# Patient Record
Sex: Female | Born: 1955 | Race: White | Hispanic: No | Marital: Married | Smoking: Current some day smoker
Health system: Southern US, Community
[De-identification: ages and names within clinical notes are randomized; demographics above are authoritative.]

## PROBLEM LIST (undated history)

## (undated) DIAGNOSIS — K633 Ulcer of intestine: Secondary | ICD-10-CM

## (undated) DIAGNOSIS — H269 Unspecified cataract: Secondary | ICD-10-CM

## (undated) DIAGNOSIS — T8859XA Other complications of anesthesia, initial encounter: Secondary | ICD-10-CM

## (undated) DIAGNOSIS — M1990 Unspecified osteoarthritis, unspecified site: Secondary | ICD-10-CM

## (undated) DIAGNOSIS — G4730 Sleep apnea, unspecified: Secondary | ICD-10-CM

## (undated) DIAGNOSIS — K8050 Calculus of bile duct without cholangitis or cholecystitis without obstruction: Secondary | ICD-10-CM

## (undated) DIAGNOSIS — M8580 Other specified disorders of bone density and structure, unspecified site: Secondary | ICD-10-CM

## (undated) DIAGNOSIS — E669 Obesity, unspecified: Secondary | ICD-10-CM

## (undated) DIAGNOSIS — T4145XA Adverse effect of unspecified anesthetic, initial encounter: Secondary | ICD-10-CM

## (undated) DIAGNOSIS — E785 Hyperlipidemia, unspecified: Secondary | ICD-10-CM

## (undated) DIAGNOSIS — E559 Vitamin D deficiency, unspecified: Secondary | ICD-10-CM

## (undated) DIAGNOSIS — F418 Other specified anxiety disorders: Secondary | ICD-10-CM

## (undated) DIAGNOSIS — J189 Pneumonia, unspecified organism: Secondary | ICD-10-CM

## (undated) DIAGNOSIS — K219 Gastro-esophageal reflux disease without esophagitis: Secondary | ICD-10-CM

## (undated) HISTORY — DX: Hyperlipidemia, unspecified: E78.5

## (undated) HISTORY — PX: OOPHORECTOMY: SHX86

## (undated) HISTORY — DX: Unspecified osteoarthritis, unspecified site: M19.90

## (undated) HISTORY — PX: TUBAL LIGATION: SHX77

## (undated) HISTORY — DX: Vitamin D deficiency, unspecified: E55.9

## (undated) HISTORY — DX: Pneumonia, unspecified organism: J18.9

## (undated) HISTORY — DX: Unspecified cataract: H26.9

## (undated) HISTORY — PX: APPENDECTOMY: SHX54

## (undated) HISTORY — DX: Calculus of bile duct without cholangitis or cholecystitis without obstruction: K80.50

## (undated) HISTORY — DX: Obesity, unspecified: E66.9

## (undated) HISTORY — DX: Other specified disorders of bone density and structure, unspecified site: M85.80

## (undated) HISTORY — PX: HYSTEROSCOPY: SHX211

## (undated) HISTORY — DX: Sleep apnea, unspecified: G47.30

## (undated) HISTORY — PX: CATARACT EXTRACTION: SUR2

## (undated) HISTORY — PX: TONSILLECTOMY: SUR1361

## (undated) HISTORY — PX: OTHER SURGICAL HISTORY: SHX169

---

## 2000-10-08 ENCOUNTER — Other Ambulatory Visit: Admission: RE | Admit: 2000-10-08 | Discharge: 2000-10-08 | Payer: Self-pay | Admitting: Gynecology

## 2000-10-18 ENCOUNTER — Other Ambulatory Visit: Admission: RE | Admit: 2000-10-18 | Discharge: 2000-10-18 | Payer: Self-pay | Admitting: Gynecology

## 2000-11-16 ENCOUNTER — Ambulatory Visit (HOSPITAL_COMMUNITY): Admission: RE | Admit: 2000-11-16 | Discharge: 2000-11-16 | Payer: Self-pay | Admitting: Gynecology

## 2001-09-20 ENCOUNTER — Other Ambulatory Visit: Admission: RE | Admit: 2001-09-20 | Discharge: 2001-09-20 | Payer: Self-pay | Admitting: Gynecology

## 2001-10-25 ENCOUNTER — Encounter: Admission: RE | Admit: 2001-10-25 | Discharge: 2001-10-25 | Payer: Self-pay | Admitting: Gynecology

## 2001-10-25 ENCOUNTER — Encounter: Payer: Self-pay | Admitting: Gynecology

## 2001-10-31 ENCOUNTER — Encounter: Admission: RE | Admit: 2001-10-31 | Discharge: 2001-10-31 | Payer: Self-pay | Admitting: *Deleted

## 2002-08-08 ENCOUNTER — Other Ambulatory Visit: Admission: RE | Admit: 2002-08-08 | Discharge: 2002-08-08 | Payer: Self-pay | Admitting: Gynecology

## 2002-08-09 ENCOUNTER — Ambulatory Visit (HOSPITAL_COMMUNITY): Admission: RE | Admit: 2002-08-09 | Discharge: 2002-08-09 | Payer: Self-pay | Admitting: Gynecology

## 2002-08-09 ENCOUNTER — Encounter (INDEPENDENT_AMBULATORY_CARE_PROVIDER_SITE_OTHER): Payer: Self-pay

## 2003-08-04 HISTORY — PX: ABDOMINAL HYSTERECTOMY: SHX81

## 2003-08-09 ENCOUNTER — Other Ambulatory Visit: Admission: RE | Admit: 2003-08-09 | Discharge: 2003-08-09 | Payer: Self-pay | Admitting: Gynecology

## 2004-07-15 ENCOUNTER — Encounter (INDEPENDENT_AMBULATORY_CARE_PROVIDER_SITE_OTHER): Payer: Self-pay | Admitting: *Deleted

## 2004-07-15 ENCOUNTER — Inpatient Hospital Stay (HOSPITAL_COMMUNITY): Admission: RE | Admit: 2004-07-15 | Discharge: 2004-07-16 | Payer: Self-pay | Admitting: Gynecology

## 2004-12-29 IMAGING — CR DG CHEST 2V
2 series · 2 of 2 positions shown · non-contrast
Comparison: None.

CLINICAL DATA: Dysfunctional uterine bleeding and pelvic pain.  Pre-op respiratory exam.
 CHEST ? 2 VIEW:

[view not recorded (1 of 2)]
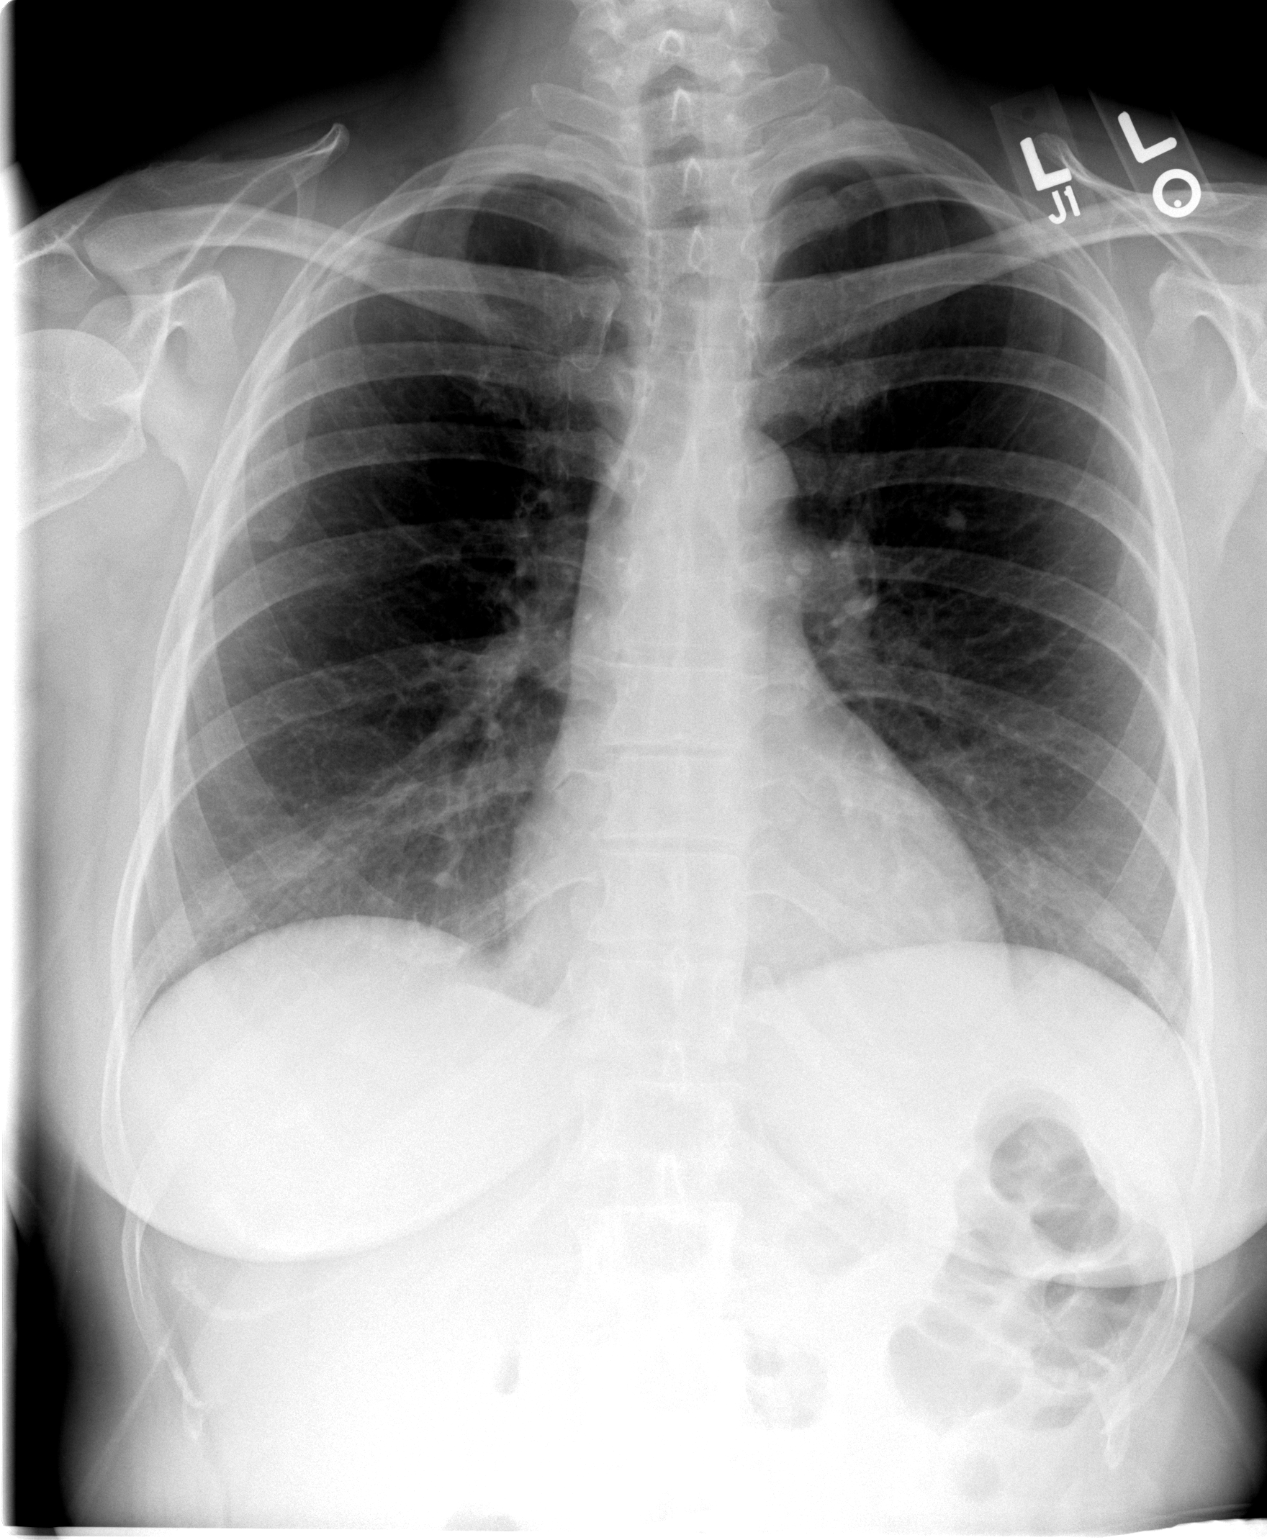

[view not recorded (2 of 2)]
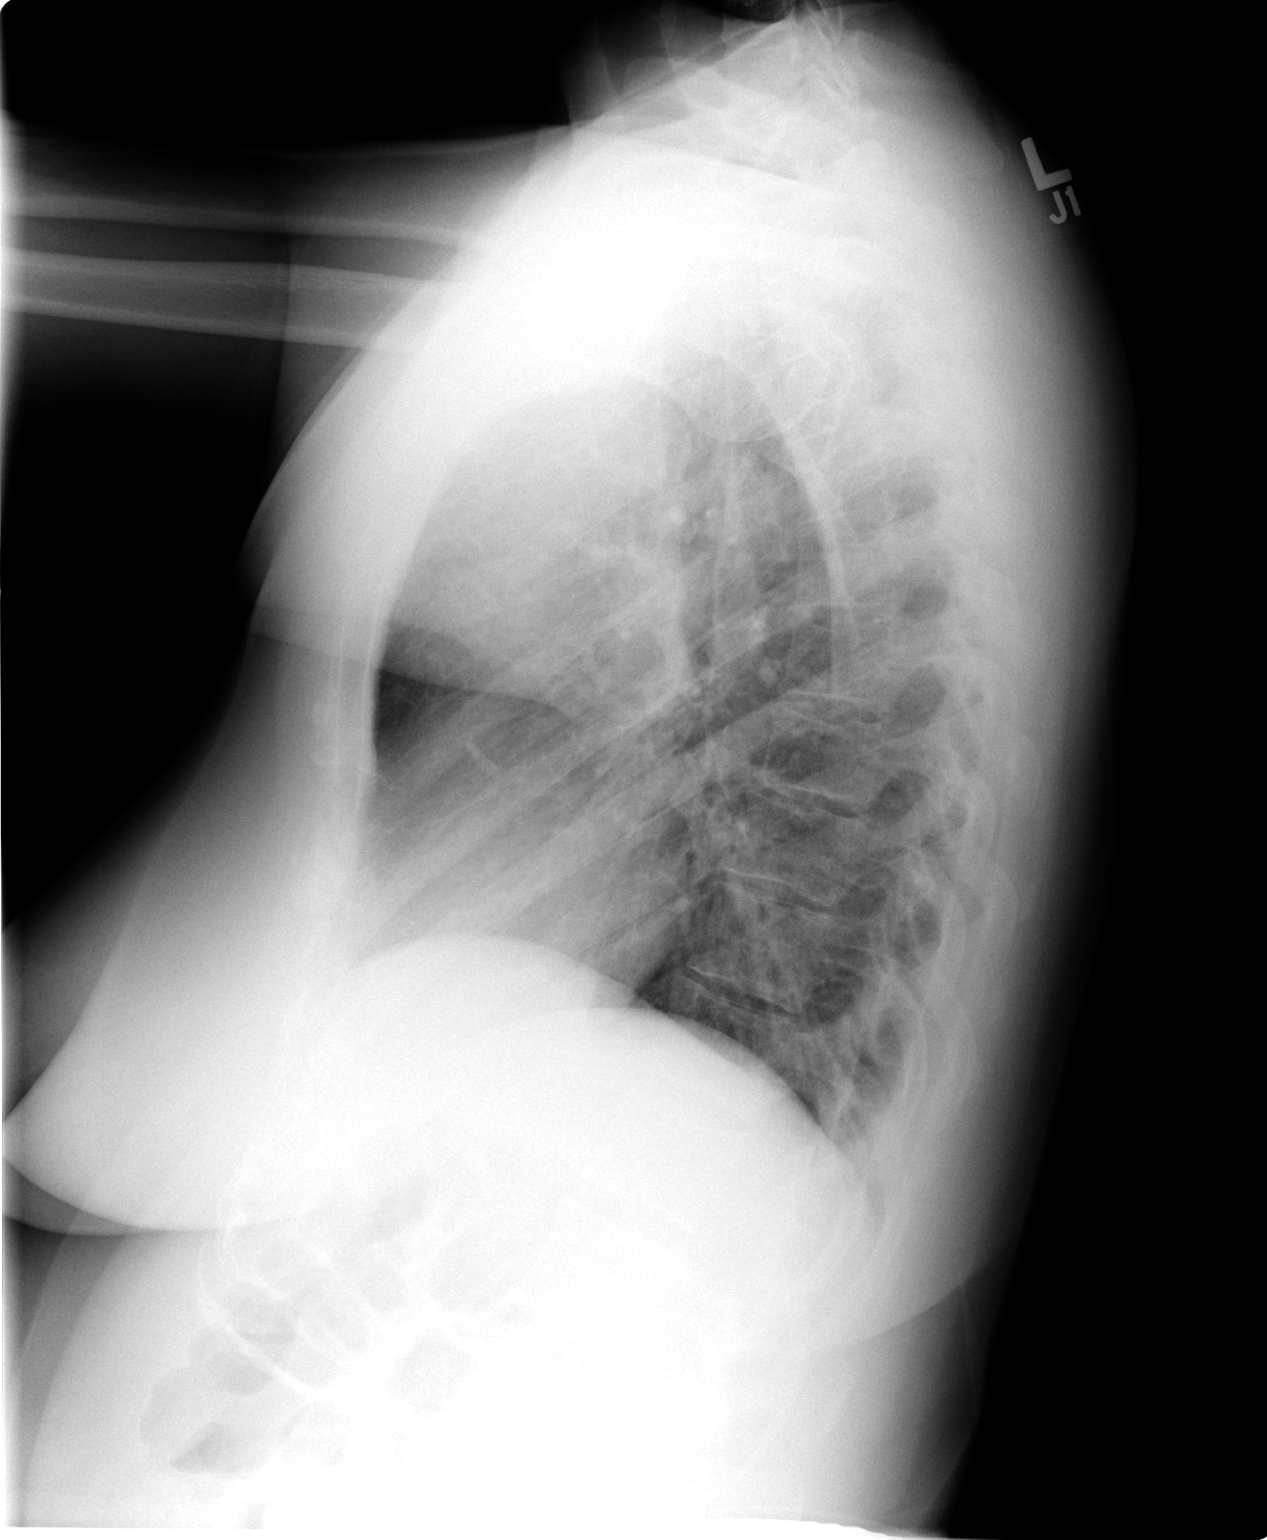

[2 of 2 positions shown; findings below may reference images not displayed]

Heart size and mediastinal contours are normal.  Both lungs are clear.  Calcified granuloma is seen in the left upper lobe and calcified left hilar lymph nodes are also seen, consistent with old granulomatous disease.
IMPRESSION: No active disease.

## 2005-08-11 ENCOUNTER — Other Ambulatory Visit: Admission: RE | Admit: 2005-08-11 | Discharge: 2005-08-11 | Payer: Self-pay | Admitting: Gynecology

## 2005-08-26 ENCOUNTER — Encounter: Admission: RE | Admit: 2005-08-26 | Discharge: 2005-08-26 | Payer: Self-pay | Admitting: Gynecology

## 2005-10-25 ENCOUNTER — Emergency Department: Payer: Self-pay | Admitting: Emergency Medicine

## 2005-11-04 ENCOUNTER — Emergency Department: Payer: Self-pay | Admitting: Emergency Medicine

## 2006-08-03 HISTORY — PX: COLONOSCOPY: SHX174

## 2006-09-15 ENCOUNTER — Other Ambulatory Visit: Admission: RE | Admit: 2006-09-15 | Discharge: 2006-09-15 | Payer: Self-pay | Admitting: Gynecology

## 2006-10-02 DIAGNOSIS — J189 Pneumonia, unspecified organism: Secondary | ICD-10-CM

## 2006-10-02 DIAGNOSIS — K633 Ulcer of intestine: Secondary | ICD-10-CM

## 2006-10-02 HISTORY — DX: Pneumonia, unspecified organism: J18.9

## 2006-10-02 HISTORY — DX: Ulcer of intestine: K63.3

## 2006-10-19 ENCOUNTER — Ambulatory Visit: Payer: Self-pay | Admitting: Internal Medicine

## 2006-11-02 ENCOUNTER — Ambulatory Visit: Payer: Self-pay | Admitting: Internal Medicine

## 2006-11-02 ENCOUNTER — Encounter (INDEPENDENT_AMBULATORY_CARE_PROVIDER_SITE_OTHER): Payer: Self-pay | Admitting: *Deleted

## 2006-12-13 ENCOUNTER — Ambulatory Visit: Payer: Self-pay | Admitting: Internal Medicine

## 2007-10-06 ENCOUNTER — Other Ambulatory Visit: Admission: RE | Admit: 2007-10-06 | Discharge: 2007-10-06 | Payer: Self-pay | Admitting: Gynecology

## 2007-10-26 ENCOUNTER — Encounter: Admission: RE | Admit: 2007-10-26 | Discharge: 2007-10-26 | Payer: Self-pay | Admitting: Gynecology

## 2009-01-09 ENCOUNTER — Other Ambulatory Visit: Admission: RE | Admit: 2009-01-09 | Discharge: 2009-01-09 | Payer: Self-pay | Admitting: Gynecology

## 2009-01-09 ENCOUNTER — Ambulatory Visit: Payer: Self-pay | Admitting: Women's Health

## 2009-01-09 ENCOUNTER — Encounter: Payer: Self-pay | Admitting: Women's Health

## 2009-06-17 ENCOUNTER — Encounter: Admission: RE | Admit: 2009-06-17 | Discharge: 2009-06-17 | Payer: Self-pay | Admitting: Internal Medicine

## 2009-09-18 ENCOUNTER — Ambulatory Visit: Payer: Self-pay | Admitting: Women's Health

## 2009-11-06 ENCOUNTER — Encounter: Admission: RE | Admit: 2009-11-06 | Discharge: 2009-12-10 | Payer: Self-pay | Admitting: Endocrinology

## 2009-11-12 ENCOUNTER — Ambulatory Visit: Payer: Self-pay | Admitting: Internal Medicine

## 2010-01-23 ENCOUNTER — Ambulatory Visit: Payer: Self-pay | Admitting: Women's Health

## 2010-12-16 NOTE — Assessment & Plan Note (Signed)
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  The biopsy showed  chronic as well as acute inflammation.  No granulomatous disease, no  dysplasia.  The patient is completely asymptomatic and remains  asymptomatic as far as any pain is concerned.  Her CBC showed normal  hemoglobin and hematocrit.  She is quite puzzled by the finding and has  been quite worried about it.   The patient has a history of complicated appendectomy at age of 43  requiring a couple of explorations and percutaneous drain from a  perforated appendix.  She denies taking any NSAID except maybe once a  month.  There is no family history of colon disease.  Her bowel habits  are regular.   PHYSICAL EXAMINATION:  VITAL SIGNS:  Blood pressure 126/72, pulse 80 and  weight 197 pounds.  LUNGS:  Clear to auscultation.  HEART:  Normal S1, S2.  ABDOMEN:  Slightly obese, but relaxed with large paramedian scar in the  right lower quadrant from previous appendectomy.  There was no palpable  tenderness and no mass, bowel sounds were normoactive.   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  There is no evidence of  inflammatory bowel disease or infection or syste ic disease.  She is a  smoker and may have some small vessel disease leading to possible bowl  ischemia.  She also had extensive surgery and has possibly compromised  microcirculation in the cecal area.  Since the ulcer is benign and she  is not having any symptoms ,nothing has to be done about it.  I would  consider repeating  her colonoscopy in 5-7 years, I would like to see her  in the office in one year and we are going to check her sedimentation  rate and ANA titer to rule out any sort of chronic inflammatory  condition today.Pt was satisfied with our conversation and plans.     Heidi Morton. Juanda Chance, MD  Electronically Signed    DMB/MedQ  DD: 12/13/2006  DT: 12/13/2006  Job #: [id number]   cc:   Heidi Pugh, M.D.

## 2010-12-19 NOTE — Op Note (Signed)
NAMEVIRGIA, Heidi Pugh                ACCOUNT NO.:  [id number]   MEDICAL RECORD NO.:  [id number]          PATIENT TYPE:  OBV   LOCATION:  9399                          FACILITY:  [facility]   PHYSICIAN:  Ivor Costa. Farrel Gobble, M.D. DATE OF BIRTH:  1956-01-16   DATE OF PROCEDURE:  07/14/2004  DATE OF DISCHARGE:                                 OPERATIVE REPORT   PREOPERATIVE DIAGNOSIS:  DUB.  Pelvic pain.   POSTOPERATIVE DIAGNOSIS:  DUB.  Pelvic pain.  Plus pelvic adhesions.   PROCEDURE:  Laparoscopically-assisted vaginal hysterectomy with BSO, lysis  of adhesions, and removal of IUD.   SURGEON:  Ivor Costa. Farrel Gobble, M.D.   ASSISTANTMarcial Pacas P. Fontaine, M.D.   ANESTHESIA:  General.   FLUIDS REPLACED:  1700 mL of Ringer's lactate.   ESTIMATED BLOOD LOSS:  150 mL.   URINE OUTPUT:  350 mL of clear urine.   FINDINGS:  The uterus was bulky.  Tubes are status post tubal ligation.  Ovaries were mobile and cystic.  There were filmy adhesions on the right  side wall from the pelvis extending toward the liver.   COMPLICATIONS:  None.   PATHOLOGY:  Uterus, cervix, tubes, and ovaries.   DESCRIPTION OF PROCEDURE:  The patient was taken to the operating room where  general anesthesia was induced, and she was placed in the dorsal lithotomy  position and prepped and draped in the usual sterile fashion.  A bivalve  speculum was placed in the vagina. The cervix was visualized and the IUD was  removed with a Kelly clamp.  The uterine manipulator was then placed.  Gloves were changed and attention was turned to the abdomen where an  infraumbilical incision was made with the scalpel.  Free flow of fluid was  noted.  Opening pressure was 7.  After pneumoperitoneum was created above  the liver, the Veress was removed and disposable 10/11 was placed in the  infraumbilical port.  Placement in the abdomen was confirmed.  Two 5 mm  ports were made in the right and left lower quadrant under direct  visualization.  The uterus was elevated.  The pelvis was inspected.  The  adhesions were noted.  The uterus was seated and the infundibulopelvic  ligament was identified on the left-hand side prior to cauterization and  transection.  The ureter was identified and noted to be free from the  pedicle.  The IP was then treated as mentioned above as well as the  posterior broad ligament and the round ligament on the left hand side.  The  anterior leaf of the broad ligament was partially incised and a portion of  the bladder flap was created.  Attention was then turned toward the right  hand side and in a similar fashion, the IP was identified, the ureter was  identified.  The IP was then cauterized, transected, as well as the  posterior broad ligament to the round in a similar fashion.  The anterior  leaf of the broad ligament was then incised.  The vesicouterine peritoneum  was identified, tented up, and the bladder flap  was then created connecting  to the left hand portion.  The bladder flap was then dissected down slightly  and attention was then turned vaginally.  The legs were elevated apart.  The  pneumoperitoneum was partially released.  The sterile weighted speculum was  then placed in the vagina.  The cervix was visualized.  A Jacobs tenaculum  was then placed.  The vaginal mucosa was injected circumferentially with 15  mL of 1% lidocaine with epinephrine.  The vaginal mucosa was then scored  circumferentially, the cervix was deviated cephalad, and a posterior  colpotomy was then performed.  The incision was extended laterally.  A long  sterile weighted speculum was then placed in the posterior cul-de-sac.  The  vaginal mucosa was dissected off bluntly. The uterosacral ligaments were  then transected and suture ligated with 0 Vicryl done bilaterally.  The  cardinal ligaments were similarly transected and suture ligated.  The  vaginal mucosa was then dissected off further anteriorly and  anterior  colpotomy was sharply performed.  The bladder was protected with a retractor  which was placed in the anterior colpotomy site.  The uterine vessels were  then transected and suture ligated again with 0 Vicryl.  The dissection  carried up until we were successfully able to deliver the fundus.  The  remaining pedicle was then transected after placement of a Heaney clamp and  the specimen was passed off the field.  Two free ties were placed on these  residual pedicles.  The pelvis was irrigated.  It was noted to be  hemostatic.  The peritoneum was then plicated to the vaginal mucosa from  above the uterosacral ligament.  Posterior vagina to above the uterosacral  ligament on the opposite side.  The short weighted speculum had been placed  prior to this.  The anterior peritoneum was grasped with an Allis clamp and  held with the anterior vaginal mucosa.  The vaginal mucosa was then plicated  anterior to posterior with 0 Vicryl popoffs.  A small area of bleeding was  noted on the vaginal mucosa which was treated with cautery.  The vagina was  otherwise unremarkable.  The pneumoperitoneum was then recreated above.  The  anterior posterior plication of the peritoneum was successful.  There was no  evidence of hematoma.  The pelvis was otherwise hemostatic.  Because the  patient was having a lot of pain, we did elect to dissect down the filmy  adhesions that were on the right hand side.  They were grasped with the  tripolar and in a stepwise fashion, the adhesions were cauterized where  necessary and otherwise transected with the tripolar.  At the end, the  entire anterior abdominal wall was noted to be free of adhesions.  The  instruments were then removed, the pneumoperitoneum was released.  The  infraumbilical fascia was closed with a figure-of-eight of 0 Vicryl.  The  umbilical port was closed with 3-0 plain.  The lower ports were closed with Dermabond.  The patient tolerated the  procedure well.  Needle, sponge, and  instrument counts correct x2.  She was given Mefoxin intraoperatively and  transferred to the PACU in stable condition.     Trac   THL/MEDQ  D:  07/14/2004  T:  07/14/2004  Job:  [id number]

## 2010-12-19 NOTE — Op Note (Signed)
NAME:  Heidi Pugh, Heidi Pugh                          ACCOUNT NO.:  [id number]   MEDICAL RECORD NO.:  [id number]                   PATIENT TYPE:  AMB   LOCATION:  SDC                                  FACILITY:  [facility]   PHYSICIAN:  Ivor Costa. Farrel Gobble, M.D.              DATE OF BIRTH:  12-Feb-1956   DATE OF PROCEDURE:  08/09/2002  DATE OF DISCHARGE:                                 OPERATIVE REPORT   PREOPERATIVE DIAGNOSES:  1. Dysfunctional bleeding.  2. Endometrial polyp.   POSTOPERATIVE DIAGNOSES:  1. Dysfunctional bleeding.  2. Endometrial polyp.   PROCEDURE:  1. Dilatation and curettage.  2. Hysteroscopy.   SURGEON:  Ivor Costa. Farrel Gobble, M.D.   ANESTHESIA:  General plus paracervical block with 10 cubic centimeters of  0.25% Marcaine solution.   FLUIDS:  I&O deficit 3% sorbitol solution was approximately 180 cubic  centimeters.   FINDINGS:  Markedly retroverted uterus.  Fundal endometrial polyp.  Normal  ostia.  Otherwise, uterine contours.   COMPLICATIONS:  None.   PATHOLOGY:  Endometrial curettings.   PROCEDURE:  The patient was taken to the operating room.  General anesthesia  was induced.  Placed in the dorsal lithotomy position.  Prior to prep  laminaria that had been placed the night before was removed from the vagina  as well as Pugh sponge.  She was then prepped and draped in the usual fashion.  The patient had reference to abdominal pain in her scar so prior to placing  the upper drape the cervix was grasped with Pugh single tooth tenaculum and  moved towards the labia without any movement of the abdominal wall.  The  upper drapes were then placed.  The cervix was noted to be dilated.  We  appreciated it was greater than Pugh 21 [place].  Pugh diagnostic scope was placed  in the cervix.  Unfortunately, she was dilated beyond the scope and good  visualization was limited based on that.  We therefore confirmed that she  was actually dilated above 31.  An operative scope was then placed  through  the cervix.  We did have some problem with seal based on the fact that she  was dilated still beyond this from the laminaria.  However, visualization  was able to be obtained.  The uterus was cleared of all blood and debris.  The endometrial polyp was seen and grasped with the resectoscope.  It was  across the fundus and difficult to obtain.  Polyp forceps were then placed  into the cervix, advanced towards the fundus in the area of the apex.  The  scope was then replaced.  What looked to be, perhaps, Pugh second polyp was  then removed again with the resectoscope.  The area of concern previously  was noted to be absent.  The uterus was noted to be cleared of all contents.  At the end of the procedure  the  instruments were then removed as well as the single tooth tenaculum was  __________ prior to placement of the single tooth tenaculum.  Paracervical  block with 10 cc of 0.25% Marcaine was placed circumferentially.  Lap,  sponge, and needle counts were correct and she was transferred to the PACU  in stable condition.                                               Ivor Costa. Farrel Gobble, M.D.    THL/MEDQ  D:  08/09/2002  T:  08/09/2002  Job:  [id number]

## 2010-12-19 NOTE — Discharge Summary (Signed)
NAMETARSHA, Heidi Pugh                ACCOUNT NO.:  [id number]   MEDICAL RECORD NO.:  [id number]          PATIENT TYPE:  INP   LOCATION:  9317                          [facility]ACILITY:  [facility]   PHYSICIAN:  Ivor Costa. Farrel Gobble, M.D. DATE OF BIRTH:  Mar 01, 1956   DATE OF ADMISSION:  07/14/2004  DATE OF DISCHARGE:  07/16/2004                                 DISCHARGE SUMMARY   PRINCIPAL DIAGNOSIS:  Dysfunctional bleeding.   ADDITIONAL DIAGNOSES:  Pelvic pain and pelvic adhesions.   PRINCIPAL PROCEDURE:  Laparoscopically assisted vaginal hysterectomy.   ADDITIONAL PROCEDURES:  Bilateral salpingo-oophorectomy, lysis of adhesions  and removal of an IUD.   Refer to the dictated H&P.   HOSPITAL COURSE:  The patient was admitted in the afternoon of December 12  and underwent the procedure as mentioned above under general anesthesia  without any complications.  Her operative findings was a bulky uterus, tubes  were remarkable for bilateral tubal ligation. The ovaries were cystic, there  was a filmy wall of adhesions extending from the right cul-de-sac to the  pericolic gutter up towards the liver. The patient was extubated in the OR,  transferred to the PACU in the postoperative floor.  In due fashion on  postoperative day one, although the patient did well ambulating and  tolerating clears and the Dilaudid was working well for pain, she was unable  to be discharged later that day, she was having a lot of discomfort which we  felt was probably related to gas which was generally making her a little bit  anxious.  Her abdomen although having normal active bowel sounds was  distended although still remained soft. Because the patient was having  issues that were difficult to separate from operative pain, we elected to  keep her overnight and aggressively manage the gas with Mylicon every four  hours. On postoperative day two, the patient was sitting up in bed seeming  more like herself.  Her gas was better,  her pain was better, she was  ambulating and urinating without any difficulty and was much more ready for  discharge. She was discharged home with instructions to followup in the  office in two weeks.  She will continue to take the Mylicon approximately  every four hours. She will take a stool softener for as long as she is on  Dilaudid. She was given a prescription for Dilaudid 1-2 every 4 hours p.r.n.  pain #40, she was also given a prescription for Climara patch 0.05 to the  abdomen q. week.  She was instructed to followup with [place] in two weeks.   POSTOPERATIVE LABS:  Her hemoglobin was 13.1, her hematocrit was 38.3, her  platelets 244 with a white count of 15.3.     Trac   THL/MEDQ  D:  07/16/2004  T:  07/16/2004  Job:  [id number]

## 2010-12-19 NOTE — H&P (Signed)
NAME:  Heidi Pugh, Heidi Pugh                          ACCOUNT NO.:  [id number]   MEDICAL RECORD NO.:  [id number]                   PATIENT TYPE:  AMB   LOCATION:  SDC                                  [facility]   PHYSICIAN:  Ivor Costa. Farrel Gobble, M.D.              DATE OF BIRTH:  Aug 16, 1955   DATE OF ADMISSION:  DATE OF DISCHARGE:                                HISTORY & PHYSICAL   CHIEF COMPLAINT:  Endometrial polyp.   HISTORY OF PRESENT ILLNESS:  The patient is Pugh 55 year old with Pugh history of  menorrhagia.  The patient states that she had Pugh severe menses where she  changed pads and tampons every 20 minutes for approximately three hours.  She presented to our office at which point she was no longer having heavy  bleeding.  Was reassured and discharged back home.  Later that evening began  bleeding again in Pugh similar fashion.  The patient states that she bled this  way for approximately two days of Pugh total seven day cycle, the remainder of  which was unremarkable.  She returned back to the office for an evaluation;  underwent Pugh sonohysterogram which showed the presence of an endometrial  polyp.  The patient had had regular periods with the exception of the two  months prior which she had minimal to absent flow.  The patient is  contracepting with Pugh tubal ligation.  The dysfunctional bleeding was  associated with severe cramping.  She had no loss of consciousness and no  other symptomatology.  Of note, recently the patient states that she has  occasional suprapubic midline pain which is not associated with her cycle  nor is it made worse with intercourse.   PAST OB/GYN HISTORY:  The patient is contracepting with tubal ligation.  Her  periods prior to this have been normal.  The patient underwent Pugh polypectomy  in the office in November which subsequently came back as Pugh benign  endometrial polyp.   PAST MEDICAL HISTORY:  Negative.   PAST SURGICAL HISTORY:  She had Pugh laparoscopic tubal  ligation.  She also had  an appendectomy.   MEDICATIONS:  None.   SOCIAL HISTORY:  Married.  No tobacco or alcohol.   ALLERGIES:  None.   PHYSICAL EXAMINATION:  GENERAL:  She is Pugh well-developed female in no acute  distress.  HEENT:  Unremarkable.  NECK:  The thyroid is smooth, nontender, and mobile.  HEART:  Regular rate.  LUNGS:  Clear to auscultation.  BREASTS:  Examined supine and upright without mass, discharge, or  retractions.  ABDOMEN:  Soft, nontender without rebound or guarding.  Multiple surgical  scars are seen.  GYNECOLOGIC:  She has normal external female genitalia.  The BUS is  negative.  The vagina is pink and moist.  The cervix is without any gross  lesions.  Bimanual examination:  The uterus is retroflexed.  The adnexa  without tenderness or fullness.  Rectovaginal examination:  The uterus is  markedly retroflexed, but is easily mobile.   LABORATORIES:  Ultrasound consistent with the examination.  Sonohysterogram  showed an endometrial polyp.   ASSESSMENT:  Menorrhagia with residual endometrial polyp after in-office  polypectomy, episode of dysfunctional bleeding.  The patient will present to  the operating room for dilatation and curettage, hysteroscopy.  Laminaria  was placed the day before.  All questions were addressed.                                               Ivor Costa. Farrel Gobble, M.D.    THL/MEDQ  D:  08/08/2002  T:  08/08/2002  Job:  [id number]

## 2010-12-19 NOTE — H&P (Signed)
NAMESHANELL, Heidi Pugh                ACCOUNT NO.:  [id number]   MEDICAL RECORD NO.:  [id number]          PATIENT TYPE:  OBV   LOCATION:  NA                            [facility]   PHYSICIAN:  Ivor Costa. Farrel Gobble, M.D. DATE OF BIRTH:  [birth year]   DATE OF ADMISSION:  DATE OF DISCHARGE:                                HISTORY & PHYSICAL   CHIEF COMPLAINT:  Pelvic pain and dysfunctional bleeding.   HISTORY OF PRESENT ILLNESS:  The patient is a 55 year old G1 P1 who has a  history of menorrhagia for which the patient had a Mirena IUD placed.  She  has had the IUD in place for approximately 1 year, despite which the patient  continues to have a monthly cycle, and in addition the patient has brown  spotting for most of the remainder of the month without any improvement over  the past 12 months.  In addition, the patient has a history of severe  mittelschmerz.  When she presented with a calendar of her symptoms, the  patient reported several days of pain - approximately 5 - which was 2 weeks  prior to the red change in her discharge as opposed to the brown that she  typically has.  The patient had a D&C hysteroscopy because of the abnormal  bleeding in January 2004 which was negative and, as mentioned, has failed to  improve.  Because of the mittelschmerz pain that she has every month and the  continued bleeding, the patient at this point prefers to have the IUD  removed and proceed with definitive surgery.   PAST OBSTETRICAL AND GYNECOLOGICAL HISTORY:  Her menses are as mentioned  above.  She had one spontaneous vaginal delivery a number of years ago.  Prior to placement of the IUD, they were contracepting with the tubal  ligation which failed to show any endometriosis at the time of that being  done.  The patient also has a history of recurrent vaginitis, both yeast and  bacterial, and is actually on suppression up until surgery.   PAST MEDICAL HISTORY:  Negative.   SURGICAL HISTORY:   As mentioned above, plus an appendectomy.   SOCIAL HISTORY:  She is married.  She smokes approximately a pack per day.  She drinks caffeine on a regular basis.  She does no formal exercise.   ALLERGIES:  The patient is allergic to PENICILLIN, CODEINE, VALIUM,  DARVOCET, flagyl, and ACETAMINOPHEN, plus other NSAIDs.   MEDICATIONS:  The patient takes no medication on a regular basis.   FAMILY HISTORY:  Negative for gynecologic cancers.  Although her mother had  some gynecologic issue, it was not believed to have been cancer.   PHYSICAL EXAMINATION:  GENERAL:  She is well appearing in no acute distress.  HEART:  Regular rate.  LUNGS:  Clear to auscultation.  ABDOMEN:  With multiple scars; otherwise, soft, nontender.  PELVIC:  She has normal external genitalia.  The BUS is negative.  The  vagina, there was some discharge which subsequently came back with rare  yeast buds.  On bimanual exam there is no  cervical motion tenderness.  The  uterus is mobile and nontender, as are the adnexa.   ASSESSMENT:  1.  Dysfunctional bleeding.  2.  Severe mittelschmerz.  3.  Recurrent bacterial vaginitis.   The patient presents now for more definitive surgery.  She will undergo  LAVH/BSO on the afternoon of July 14, 2004.  We will remove the IUD at  the time of surgery.  We elected not to give her pain medicine at her  preoperative visit beause of her multiple allergies/sensitivities.     Trac   THL/MEDQ  D:  07/11/2004  T:  07/11/2004  Job:  [id number]

## 2010-12-19 NOTE — Op Note (Signed)
[facility]  Patient:    Heidi Pugh, Heidi Pugh                       MRN: [id number] Proc. Date: 11/16/00 Adm. Date:  [id number] Attending:  Douglass Rivers                           Operative Report  PREOPERATIVE DIAGNOSES:       1. Undesired fertility.                               2. Complex adnexal mass.  POSTOPERATIVE DIAGNOSES:      1. Undesired fertility.                               2. Complex adnexal mass.                               3. Involuting hemorrhagic cyst.  OPERATION:                    Abdominal laparoscopy and bilateral tubal                               ligation with cautery.  SURGEON:                      Douglass Rivers, M.D.  ASSISTANT:  ANESTHESIA:                   General anesthesia.  ESTIMATED BLOOD LOSS:         Minimal.  FINDINGS:                     Normal uterus, tubes, as well as right ovary. The left ovary had a hemorrhagic cyst that was partially ruptured, multiple omental adhesions on the right.  COMPLICATIONS:                None.  PATHOLOGY:                    None.  DESCRIPTION OF PROCEDURE:     The patient was taken to the operating room. General anesthesia was induced and she was placed in the dorsal lithotomy position, prepped and draped in usual sterile fashion.  Bivalve speculum was placed in the vagina.  The cervix was visualized and uterine manipulator was then placed. Attention was then turned to the abdomen and an infraumbilical incision was made with the scalpel.  The incision was sharply dissected down to the fascia which was scored in the peritoneum which was entered bluntly. Secondary to multiple adhesions due to multiple surgeries that the patient had had, placement in the abdominal cavity was confirmed.  The #10 laparoscope was then advanced through this port.  Again, we confirmed our placement.  The pneumoperitoneum was created until the tympany was appreciated by the liver. Inspection of  the abdomen revealed that there was marked thin omental adhesions obscuring the right side of the upper abdomen but the pelvis was actually free of them.  There was a small omental adhesion that went to the anterior abdominal wall that was able to be transected and after  being treated with electrocautery, a midline incision was then made through which a #5 port was inserted.  Blunt probe was used to inspect the pelvis. Initially we thought perhaps that this was endometrioma on the left ovary.  There was no other evidence of endometriosis in the pelvis.  The tubes were elevated and treated with electrocautery in three serial steps. This was done bilaterally. A suction irrigator was then advanced through the infraumbilical port and the area of question on the left ovary was irrigated.  Reinspection of it noted that there was a fine amount of bleeding that was coming from this area.  The ovary was stabilized and using electrocautery, a small rent was made in the ovary at the area of questionable endometriosis.  At this point we noted old hemorrhagic thin fluid come from the port. This was also consistent with the area that was bleeding prior to any manipulation of the ovary.  The area was then treated with cautery and was noted to be hemostatic afterwards. The rest of the ovary was unremarkable. There was no other evidence of adhesions or endometriosis in the pelvis.  The instruments were then removed under direct visualization.  The infraumbilical port fascia was closed with figure-of-eight of 0 Vicryl.  The skin was closed with 4-0 plain. The sub q in the suprapubic site was closed with a single suture of 4-0 plain.  Both ports had tincture of benzoin and Steri-Strips placed over them and both were injected with 0.25% Marcaine for a total of 10 cc between the two incisions.  Instruments were removed from the vagina.  There was a moderate amount of bleeding but the patient was on her menses.   The cervix was unremarkable. The patient tolerated the procedure well.  Sponge, lap and needle counts correct x 2.  She was transferred to the PACU in stable condition. DD:  11/16/00 TD:  11/17/00 Job: [id number]

## 2011-07-17 ENCOUNTER — Other Ambulatory Visit: Payer: Self-pay | Admitting: *Deleted

## 2011-07-17 MED ORDER — DULOXETINE HCL 60 MG PO CPEP: 60.0000 mg | ORAL_CAPSULE | Freq: Every day | ORAL | Status: DC

## 2011-07-17 NOTE — Telephone Encounter (Signed)
PT REQUESTED REFILL, KNOWS SHE OVERDUE FOR AEX, NANCY OK FOR REFILL DONT WANT TO STOP ABRUPTLY. REFILL SENT.

## 2011-07-24 ENCOUNTER — Telehealth: Payer: Self-pay | Admitting: *Deleted

## 2011-07-24 NOTE — Telephone Encounter (Signed)
Took care of Prior Auth for patient on Cymbalta 60mg .  Approved till 07/23/12.  Auth # [id number] cost $95.

## 2011-08-05 ENCOUNTER — Emergency Department (HOSPITAL_COMMUNITY)
Admission: EM | Admit: 2011-08-05 | Discharge: 2011-08-05 | Disposition: A | Discharge status: Home / Self Care | Payer: 59 | Attending: Emergency Medicine | Admitting: Emergency Medicine

## 2011-08-05 ENCOUNTER — Encounter (HOSPITAL_COMMUNITY): Payer: Self-pay | Admitting: Emergency Medicine

## 2011-08-05 DIAGNOSIS — L02219 Cutaneous abscess of trunk, unspecified: Secondary | ICD-10-CM | POA: Insufficient documentation

## 2011-08-05 DIAGNOSIS — E789 Disorder of lipoprotein metabolism, unspecified: Secondary | ICD-10-CM | POA: Insufficient documentation

## 2011-08-05 DIAGNOSIS — L0291 Cutaneous abscess, unspecified: Secondary | ICD-10-CM

## 2011-08-05 DIAGNOSIS — L03319 Cellulitis of trunk, unspecified: Secondary | ICD-10-CM | POA: Insufficient documentation

## 2011-08-05 DIAGNOSIS — R109 Unspecified abdominal pain: Secondary | ICD-10-CM | POA: Insufficient documentation

## 2011-08-05 DIAGNOSIS — F17200 Nicotine dependence, unspecified, uncomplicated: Secondary | ICD-10-CM | POA: Insufficient documentation

## 2011-08-05 DIAGNOSIS — Z79899 Other long term (current) drug therapy: Secondary | ICD-10-CM | POA: Insufficient documentation

## 2011-08-05 DIAGNOSIS — E119 Type 2 diabetes mellitus without complications: Secondary | ICD-10-CM | POA: Insufficient documentation

## 2011-08-05 MED ORDER — SULFAMETHOXAZOLE-TRIMETHOPRIM 800-160 MG PO TABS: 1.0000 | ORAL_TABLET | Freq: Two times a day (BID) | ORAL | Status: AC

## 2011-08-05 MED ORDER — DIPHENHYDRAMINE HCL 25 MG PO CAPS
50.0000 mg | ORAL_CAPSULE | Freq: Once | ORAL | Status: AC
  Administered 2011-08-05: 50 mg via ORAL
  Filled 2011-08-05: qty 2

## 2011-08-05 MED ORDER — LORAZEPAM 1 MG PO TABS: 1.0000 mg | ORAL_TABLET | Freq: Once | ORAL | Status: AC

## 2011-08-05 MED ORDER — LORAZEPAM 1 MG PO TABS
1.0000 mg | ORAL_TABLET | Freq: Once | ORAL | Status: AC
  Administered 2011-08-05: 1 mg via ORAL
  Filled 2011-08-05: qty 1

## 2011-08-05 NOTE — ED Notes (Signed)
PA at bedside performing I&D

## 2011-08-05 NOTE — ED Provider Notes (Signed)
History     CSN: [id number]  Arrival date & time 08/05/11  1904   First MD Initiated Contact with Patient 08/05/11 2031      Chief Complaint  Patient presents with  . Abscess    (Consider location/radiation/quality/duration/timing/severity/associated sxs/prior treatment) HPI Comments: Patient presents emergency department with a chief complaint of groin abscess present yesterday.  Patient currently is not taking steroids and denies being HIV positive.  No the patient does have a history of diabetes controlled with metformin.  The patient denies fevers night sweats chills.  Patient is a 56 y.o. female presenting with abscess. The history is provided by the patient.  Abscess  This is a new problem. The current episode started yesterday. The problem occurs rarely. The problem has been gradually worsening. The abscess is present on the groin. The problem is moderate. The abscess is characterized by redness. The abscess first occurred at home. Pertinent negatives include no anorexia, no decrease in physical activity, not sleeping less, not drinking less, no fever, no fussiness, not sleeping more, no diarrhea, no vomiting, no congestion, no rhinorrhea, no sore throat, no decreased responsiveness and no cough. Her past medical history does not include atopy in family or skin abscesses in family.    Past Medical History  Diagnosis Date  . Pneumonia 10/2006  . Gastrointestinal ulcer 10/2006  . Diabetes mellitus   . High cholesterol     Past Surgical History  Procedure Date  . Tubal ligation   . Hysteroscopy     D & C  . Abdominal hysterectomy 2005    LAVH-BSO,, MENORRHAGIA AND PELVIC PAIN    Family History  Problem Relation Age of Onset  . Cancer Father     COLON; PROSTATE  . Diabetes Sister     History  Substance Use Topics  . Smoking status: Current Everyday Smoker  . Smokeless tobacco: Not on file  . Alcohol Use: Yes    OB History    Grav Para Term Preterm Abortions TAB  SAB Ect Mult Living                  Review of Systems  Constitutional: Negative for fever, chills, diaphoresis, activity change and decreased responsiveness.       Denies night sweats  HENT: Negative for congestion, sore throat, rhinorrhea and neck stiffness.   Eyes: Negative for visual disturbance.  Respiratory: Negative for cough and shortness of breath.   Cardiovascular: Negative for chest pain.  Gastrointestinal: Negative for vomiting, abdominal pain, diarrhea and anorexia.  Genitourinary: Negative for dysuria, urgency and frequency.  Musculoskeletal: Negative for gait problem.  Skin: Negative for color change and rash.  Neurological: Negative for dizziness, light-headedness and headaches.  Hematological: Negative for adenopathy.  All other systems reviewed and are negative.    Allergies  Aspirin; Codeine; Darvocet; Etodolac; Flagyl; Lortab; Nsaids; Penicillins; Terazol; Ultram; and Valium  Home Medications   Current Outpatient Rx  Name Route Sig Dispense Refill  . DULOXETINE HCL 60 MG PO CPEP Oral Take 60 mg by mouth daily.      Marland Kitchen METFORMIN HCL 500 MG PO TABS Oral Take 500 mg by mouth 2 (two) times daily with a meal.      . PRAVASTATIN SODIUM 20 MG PO TABS Oral Take 20 mg by mouth daily.        BP 136/83  Pulse 108  Temp(Src) 98 F (36.7 C) (Oral)  Resp 19  SpO2 99%  Physical Exam  Nursing note and vitals  reviewed. Constitutional: She is oriented to person, place, and time. Vital signs are normal. She appears well-developed and well-nourished. She does not appear ill. No distress.  HENT:  Head: Normocephalic and atraumatic.  Eyes: EOM are normal. Pupils are equal, round, and reactive to light.  Neck: Normal range of motion. Neck supple.  Cardiovascular: Normal rate and regular rhythm.   Pulmonary/Chest: Effort normal.  Musculoskeletal: She exhibits no edema.  Lymphadenopathy:       Head (right side): No submental, no preauricular and no posterior auricular  adenopathy present.       Head (left side): No submental, no submandibular, no preauricular and no posterior auricular adenopathy present.    She has no cervical adenopathy.    She has no axillary adenopathy.  Neurological: She is alert and oriented to person, place, and time.  Skin: Skin is warm and dry. No rash noted. She is not diaphoretic.          Cone-shaped abscess located in the right inguinal region.  Abscess not currently draining.  Fluctuant and tender to palpation.  7 cm circumference of erythema located around the abscess.  This area was warm to touch.    ED Course  Procedures (including critical care time)  Labs Reviewed - No data to display No results found.   No diagnosis found.  Pt does not have true allergy to ativan, states it makes her feel loopy denies urticaria or throat closing reaction.  Patient given 1 mg of Ativan to help calm her nerves prior to the I&D.  INCISION AND DRAINAGE Performed by: Jaci Carrel Consent: Verbal consent obtained. Risks and benefits: risks, benefits and alternatives were discussed Type: abscess  Body area: Right groin  Anesthesia: local infiltration  Local anesthetic: lidocaine 1 % without epinephrine  Anesthetic total: 2 ml  Complexity: complex Blunt dissection to break up loculations  Drainage: purulent  Drainage amount: Minimal to moderate   Packing material: 1/4 in iodoform gauze  Patient tolerance: Patient tolerated the procedure well with no immediate complications.      MDM  Abscess and cellulitis  Patient cellulitis marked with pen in the emergency department.  It was discussed with patient and husband to be sure that the area of erythema does not cross this line.  Patient will be started on an antibiotic for cellulitis treatment with instructions to followup in the emergency department in 2 days.  Patient is agreeable to plan.         [place], [place] 08/05/11 2259

## 2011-08-05 NOTE — ED Notes (Signed)
PT. REPORTS PROGRESSING RIGHT GROIN ABSCESS ONSET YESTERDAY MORNING. DENIES FEVER OR CHILLS . NO DISCHARGE.

## 2011-08-05 NOTE — ED Notes (Signed)
Presents with abscess located in area between rt groin and leg. Pt hx of abscesses. Observed area is red, warm, and tender to touch but without skin breakdown. Pt denies fever or other sx.

## 2011-08-06 NOTE — ED Provider Notes (Signed)
Medical screening examination/treatment/procedure(s) were performed by non-physician practitioner and as supervising physician I was immediately available for consultation/collaboration.  Raeford Razor, MD 08/06/11 [phone]

## 2012-01-07 ENCOUNTER — Encounter: Payer: Self-pay | Admitting: Women's Health

## 2012-01-07 ENCOUNTER — Ambulatory Visit (INDEPENDENT_AMBULATORY_CARE_PROVIDER_SITE_OTHER): Payer: 59 | Admitting: Women's Health

## 2012-01-07 VITALS — BP 124/78 | Ht 65.5 in | Wt 218.0 lb

## 2012-01-07 DIAGNOSIS — E119 Type 2 diabetes mellitus without complications: Secondary | ICD-10-CM | POA: Insufficient documentation

## 2012-01-07 DIAGNOSIS — F329 Major depressive disorder, single episode, unspecified: Secondary | ICD-10-CM

## 2012-01-07 DIAGNOSIS — F17200 Nicotine dependence, unspecified, uncomplicated: Secondary | ICD-10-CM

## 2012-01-07 DIAGNOSIS — E7800 Pure hypercholesterolemia, unspecified: Secondary | ICD-10-CM

## 2012-01-07 DIAGNOSIS — F418 Other specified anxiety disorders: Secondary | ICD-10-CM

## 2012-01-07 DIAGNOSIS — F17210 Nicotine dependence, cigarettes, uncomplicated: Secondary | ICD-10-CM

## 2012-01-07 DIAGNOSIS — F32A Depression, unspecified: Secondary | ICD-10-CM

## 2012-01-07 DIAGNOSIS — Z01419 Encounter for gynecological examination (general) (routine) without abnormal findings: Secondary | ICD-10-CM

## 2012-01-07 DIAGNOSIS — F341 Dysthymic disorder: Secondary | ICD-10-CM

## 2012-01-07 MED ORDER — DULOXETINE HCL 60 MG PO CPEP: 60.0000 mg | ORAL_CAPSULE | Freq: Every day | ORAL | Status: DC

## 2012-01-07 NOTE — Patient Instructions (Signed)

## 2012-01-07 NOTE — Progress Notes (Signed)
Heidi Pugh 1955/08/09 [id number]    History:    The patient presents for annual exam.  Hx type 2 diabetes, smoker 1/2 ppd, and  hypercholesterolemia. Reports blood sugars and Hemoglobin AIC have been doing well. Reports some hyperglycemia into 160s in am, encouraged by endocrinologist to eat a snack of yogurt, cheese, or meat at bedtime.  Reports halos around lights while driving at night. Smokes 1/2 ppd, has tried chantix, nicotine patches, and nicotine gum in past. Reports insomnia and bad dreams with Chantix, rash with nicotine patches, and throat swelling with nicotine gum.  Llast mammogram 2010, normal.  Bone Density scan 2008, normal. History of normal Paps. LAVH BSO on no ERT. Reports Cymbalta helps with mood and arthritis pain.  Past medical history, past surgical history, family history and social history were all reviewed and documented in the EPIC chart.. Husband immunocompromised from splenectomy. Works in garden and yard daily for exercise. Last colonoscopy 2008.  ROS:  A  ROS was performed and pertinent positives and negatives are included in the history.  Exam:  Filed Vitals:   01/07/12 1020  BP: 124/78    General appearance:  Normal Head/Neck:  Normal, without cervical or supraclavicular adenopathy. Thyroid:  Symmetrical, normal in size, without palpable masses or nodularity. Respiratory  Effort:  Normal  Auscultation:  Clear without wheezing or rhonchi Cardiovascular  Auscultation:  Regular rate, without rubs, murmurs or gallops  Edema/varicosities:  Not grossly evident Abdominal  Soft,nontender, 4 cm probable lipoma/below U right side /nontender present for years  Liver/spleen:  No organomegaly noted  Hernia:  None appreciated  Skin  Inspection:  Grossly normal  Palpation:  Grossly normal Neurologic/psychiatric  Orientation:  Normal with appropriate conversation.  Mood/affect:  Normal  Genitourinary    Breasts: Examined lying and sitting.     Right: Without  masses, retractions, discharge or axillary adenopathy.     Left: Without masses, retractions, discharge or axillary adenopathy.   Inguinal/mons:  Normal without inguinal adenopathy  External genitalia:  Normal  BUS/Urethra/Skene's glands:  Normal  Bladder:  Normal  Vagina:  Normal  Cervix:  Absent  Uterus: Absent  Adnexa/parametria:     Rt: Without masses or tenderness.   Lt: Without masses or tenderness.  Anus and perineum: Normal  Digital rectal exam: Normal sphincter tone without palpated masses or tenderness  Assessment/Plan:  55 y.o.MWF G2P1  for annual exam with complaint of difficulty driving at night due to vision changes.  Normal post menopausal exam on no ERT Depression-stable on Cymbalta 60 Smoker half pack per day Type 2 diabetes/hypercholesterolemia-Dr Balan manages labs and meds Obesity    Plan: SBE's, encouraged yearly mammograms, which is overdue instructed to schedule. Encouraged calcium rich diet,  vitamin D 2000 IU daily, cut calories for weight loss, vaginal lubricants with intercourse and decrease amount smoke per day.  Encouraged pneumovax vaccine at next visit with primary care provider. Encouraged yearly influenza vaccines. Encouraged follow up with opthamologist for yearly eye exams.  Cymbalta 60 mg by mouth daily, prescription, proper use given and reviewed. No labs or Pap, Pap screening recommendations reviewed. Home Hemoccult card given. Declines repeating bone density at this time.   Harrington Challenger [facility], 11:17 AM 01/07/2012

## 2012-02-05 ENCOUNTER — Telehealth: Payer: Self-pay | Admitting: *Deleted

## 2012-02-05 MED ORDER — FLUCONAZOLE 150 MG PO TABS: 150.0000 mg | ORAL_TABLET | Freq: Once | ORAL | Status: DC

## 2012-02-05 NOTE — Telephone Encounter (Signed)
Pt called c/o yeast symptoms, treated with OTC monistat with some relief. Requested Diflucan. Advised OV if still symptomatic after the prescription.

## 2012-04-20 ENCOUNTER — Ambulatory Visit: Payer: 59 | Admitting: Women's Health

## 2012-04-20 ENCOUNTER — Encounter: Payer: Self-pay | Admitting: Women's Health

## 2012-04-20 ENCOUNTER — Ambulatory Visit (INDEPENDENT_AMBULATORY_CARE_PROVIDER_SITE_OTHER): Payer: 59 | Admitting: Women's Health

## 2012-04-20 DIAGNOSIS — F411 Generalized anxiety disorder: Secondary | ICD-10-CM

## 2012-04-20 DIAGNOSIS — F419 Anxiety disorder, unspecified: Secondary | ICD-10-CM

## 2012-04-20 MED ORDER — ALPRAZOLAM 0.25 MG PO TABS: 0.2500 mg | ORAL_TABLET | Freq: Every evening | ORAL | Status: DC | PRN

## 2012-04-20 NOTE — Patient Instructions (Signed)
Triple antibiotic cream Restless Legs Syndrome Restless legs syndrome is a movement disorder. It may also be called a sensori-motor disorder.  CAUSES  No one knows what specifically causes restless legs syndrome, but it tends to run in families. It is also more common in people with low iron, in pregnancy, in people who need dialysis, and those with nerve damage (neuropathy).Some medications may make restless legs syndrome worse.Those medications include drugs to treat high blood pressure, some heart conditions, nausea, colds, allergies, and depression. SYMPTOMS Symptoms include uncomfortable sensations in the legs. These leg sensations are worse during periods of inactivity or rest. They are also worse while sitting or lying down. Individuals that have the disorder describe sensations in the legs that feel like:  Pulling.   Drawing.   Crawling.   Worming.   Boring.   Tingling.   Pins and needles.   Prickling.   Pain.  The sensations are usually accompanied by an overwhelming urge to move the legs. Sudden muscle jerks may also occur. Movement provides temporary relief from the discomfort. In rare cases, the arms may also be affected. Symptoms may interfere with going to sleep (sleep onset insomnia). Restless legs syndrome may also be related to periodic limb movement disorder (PLMD). PLMD is another more common motor disorder. It also causes interrupted sleep. The symptoms from PLMD usually occur most often when you are awake. TREATMENT  Treatment for restless legs syndrome is symptomatic. This means that the symptoms are treated.   Massage and cold compresses may provide temporary relief.   Walk, stretch, or take a cold or hot bath.   Get regular exercise and a good night's sleep.   Avoid caffeine, alcohol, nicotine, and medications that can make it worse.   Do activities that provide mental stimulation like discussions, needlework, and video games. These may be helpful if you  are not able to walk or stretch.  Some medications are effective in relieving the symptoms. However, many of these medications have side effects. Ask your caregiver about medications that may help your symptoms. Correcting iron deficiency may improve symptoms for some patients. Document Released: 07/10/2002 Document Revised: 07/09/2011 Document Reviewed: 10/16/2010 [facility] Patient Information 2012 [place], [place].

## 2012-04-20 NOTE — Progress Notes (Signed)
Patient ID: Heidi Pugh, female   DOB: [birth year], 56 y.o.   MRN: [id number] Presents with 2 complaints. States had a 2-3 inch erythematous area on lower right abdomen 2 weeks ago that has now cleared. Also complained that when sleeping her legs move/jump and often feels tingling and sensation of things crawling on her legs when nothing is on her legs for many months. Denies a fever, abdominal pain, smoking less. History of a skin abscess on right upper thigh.  Exam: Abdomen soft, right vertical scar well healed with area on lower right abdomen appears to be a healing pimple-like area. No erythema noted, skin warm, nontender, non-indurated. Legs - bilateral warm to touch with good pedal pulses, good color, mobility with no visible edema.  Small superficial skin irritation right lower quadrant of abdomen Questionable restless leg syndrome/history of type 2 diabetes/anxiety  Plan: Apply Neosporin or triple antibiotic cream to small skin irritation, keep clean and dry, and return to office if area becomes erythematous. Reviewed importance of good skin care and early treatment to prevent abscesses. Reviewed discussing with primary care medications for restless leg, will try Xanax 0.25 at at bedtime to help with rest. Reviewed addictive properties of Xanax and to use sparingly. Reviewed importance of healthy diet, regular exercise and weight loss for health to help prevent diabetic neuropathy.

## 2012-05-09 ENCOUNTER — Other Ambulatory Visit: Payer: Self-pay | Admitting: Anesthesiology

## 2012-05-09 DIAGNOSIS — Z1211 Encounter for screening for malignant neoplasm of colon: Secondary | ICD-10-CM

## 2012-05-10 ENCOUNTER — Other Ambulatory Visit: Payer: Self-pay | Admitting: Women's Health

## 2012-05-10 DIAGNOSIS — Z1211 Encounter for screening for malignant neoplasm of colon: Secondary | ICD-10-CM

## 2012-07-22 ENCOUNTER — Other Ambulatory Visit: Payer: Self-pay | Admitting: Gynecology

## 2012-07-22 DIAGNOSIS — Z1231 Encounter for screening mammogram for malignant neoplasm of breast: Secondary | ICD-10-CM

## 2012-08-19 ENCOUNTER — Encounter: Payer: Self-pay | Admitting: Women's Health

## 2012-08-19 ENCOUNTER — Ambulatory Visit (INDEPENDENT_AMBULATORY_CARE_PROVIDER_SITE_OTHER): Payer: 59 | Admitting: Women's Health

## 2012-08-19 VITALS — BP 128/76 | Ht 65.0 in | Wt 218.0 lb

## 2012-08-19 DIAGNOSIS — Z716 Tobacco abuse counseling: Secondary | ICD-10-CM

## 2012-08-19 DIAGNOSIS — Z7189 Other specified counseling: Secondary | ICD-10-CM

## 2012-08-19 NOTE — Progress Notes (Signed)
Patient ID: Heidi Pugh, female   DOB: 04/20/1956, 57 y.o.   MRN: [id number] Presents to have blood work drawn for wellness program for insurance purposes. Has tried cutting back to quit smoking. Has seen a hypnotist. Had bad reaction to Chantix, rash with Nicoderm patch, throat swelling with Nicorette, and questions if she should try Wellbutrin. Reviewed in same family as Chantix and not best to use. Using the electronic cigarette, only smoked 6 cigarettes last week.  Smoker desiring to quit  Plan: Continue with electronic cigarette, continue to decrease regular cigarettes. Congratulated on decreasing tremendously. Labs drawn and sent to [facility].

## 2012-08-23 ENCOUNTER — Ambulatory Visit
Admission: RE | Admit: 2012-08-23 | Discharge: 2012-08-23 | Disposition: A | Discharge status: Home / Self Care | Payer: 59 | Source: Ambulatory Visit | Attending: Gynecology | Admitting: Gynecology

## 2012-08-23 DIAGNOSIS — Z1231 Encounter for screening mammogram for malignant neoplasm of breast: Secondary | ICD-10-CM

## 2012-12-08 ENCOUNTER — Encounter: Payer: Self-pay | Admitting: Women's Health

## 2012-12-08 ENCOUNTER — Ambulatory Visit (INDEPENDENT_AMBULATORY_CARE_PROVIDER_SITE_OTHER): Payer: 59 | Admitting: Women's Health

## 2012-12-08 DIAGNOSIS — B373 Candidiasis of vulva and vagina: Secondary | ICD-10-CM

## 2012-12-08 DIAGNOSIS — L738 Other specified follicular disorders: Secondary | ICD-10-CM

## 2012-12-08 DIAGNOSIS — L739 Follicular disorder, unspecified: Secondary | ICD-10-CM

## 2012-12-08 LAB — WET PREP FOR TRICH, YEAST, CLUE
Trich, Wet Prep: NONE SEEN
Yeast Wet Prep HPF POC: NONE SEEN

## 2012-12-08 MED ORDER — FLUCONAZOLE 150 MG PO TABS: 150.0000 mg | ORAL_TABLET | Freq: Once | ORAL | Status: DC

## 2012-12-08 MED ORDER — SULFAMETHOXAZOLE-TRIMETHOPRIM 800-160 MG PO TABS: 1.0000 | ORAL_TABLET | Freq: Two times a day (BID) | ORAL | Status: DC

## 2012-12-08 NOTE — Progress Notes (Signed)
Patient ID: Marlowe Aschoff, female   DOB: 03/09/1956, 57 y.o.   MRN: [id number] Presents with painful, firm,  2cm erythematous folliculitis on right upper inner thigh.Tried hot saltwater with relief, area half the size as yesterday. In Jan 2013, boil in same location had to be I&D at ER. Vaginal itching. History of yeast infections. DM2, obese.   Exam: External genitalia without erythema. Sebaceous cyst- left labia/no change. Speculum exam- scant white discharge. Wet prep-negative.   Folliculitis Obesity  DM  Plan: Bactrim DS 800-160 mg twice daily for 5 days. Diflucan 150 mg if needed. Instructed to call if symptoms persist after 5 days, continue warm soaks, loose clothes. Annual exam in June. Encouraged healthy diet and exercise for weight loss and health.

## 2012-12-08 NOTE — Patient Instructions (Addendum)
Folliculitis   Folliculitis is redness, soreness, and swelling (inflammation) of the hair follicles. This condition can occur anywhere on the body. People with weakened immune systems, diabetes, or obesity have a greater risk of getting folliculitis.  CAUSES   Bacterial infection. This is the most common cause.   Fungal infection.   Viral infection.   Contact with certain chemicals, especially oils and tars.  Long-term folliculitis can result from bacteria that live in the nostrils. The bacteria may trigger multiple outbreaks of folliculitis over time.  SYMPTOMS  Folliculitis most commonly occurs on the scalp, thighs, legs, back, buttocks, and areas where hair is shaved frequently. An early sign of folliculitis is a small, white or yellow, pus-filled, itchy lesion (pustule). These lesions appear on a red, inflamed follicle. They are usually less than 0.2 inches (5 mm) wide. When there is an infection of the follicle that goes deeper, it becomes a boil or furuncle. A group of closely packed boils creates a larger lesion (carbuncle). Carbuncles tend to occur in hairy, sweaty areas of the body.  DIAGNOSIS   Your caregiver can usually tell what is wrong by doing a physical exam. A sample may be taken from one of the lesions and tested in a lab. This can help determine what is causing your folliculitis.  TREATMENT   Treatment may include:   Applying warm compresses to the affected areas.   Taking antibiotic medicines orally or applying them to the skin.   Draining the lesions if they contain a large amount of pus or fluid.   Laser hair removal for cases of long-lasting folliculitis. This helps to prevent regrowth of the hair.  HOME CARE INSTRUCTIONS   Apply warm compresses to the affected areas as directed by your caregiver.   If antibiotics are prescribed, take them as directed. Finish them even if you start to feel better.   You may take over-the-counter medicines to relieve itching.   Do not shave  irritated skin.   Follow up with your caregiver as directed.  SEEK IMMEDIATE MEDICAL CARE IF:    You have increasing redness, swelling, or pain in the affected area.   You have a fever.  MAKE SURE YOU:   Understand these instructions.   Will watch your condition.   Will get help right away if you are not doing well or get worse.  Document Released: 09/28/2001 Document Revised: 01/19/2012 Document Reviewed: 10/20/2011  [employer] Patient Information 2013 [employer].

## 2012-12-28 ENCOUNTER — Ambulatory Visit: Payer: 59 | Admitting: Women's Health

## 2013-01-09 ENCOUNTER — Encounter: Payer: 59 | Admitting: Women's Health

## 2013-01-13 ENCOUNTER — Other Ambulatory Visit: Payer: Self-pay

## 2013-01-13 DIAGNOSIS — F329 Major depressive disorder, single episode, unspecified: Secondary | ICD-10-CM

## 2013-01-13 DIAGNOSIS — F32A Depression, unspecified: Secondary | ICD-10-CM

## 2013-01-13 MED ORDER — DULOXETINE HCL 60 MG PO CPEP: 60.0000 mg | ORAL_CAPSULE | Freq: Every day | ORAL | Status: DC

## 2013-01-13 NOTE — Telephone Encounter (Signed)
Okay to call in for patient, please have her schedule annual exam.

## 2013-01-13 NOTE — Telephone Encounter (Signed)
CE due. Had one scheduled for 6/9 but cancelled it and has not rescheduled currently.

## 2013-01-19 ENCOUNTER — Encounter: Payer: 59 | Admitting: Women's Health

## 2013-04-10 ENCOUNTER — Other Ambulatory Visit: Payer: Self-pay | Admitting: Women's Health

## 2013-06-08 ENCOUNTER — Other Ambulatory Visit: Payer: Self-pay

## 2013-09-19 ENCOUNTER — Emergency Department: Payer: Self-pay | Admitting: Emergency Medicine

## 2013-09-19 LAB — COMPREHENSIVE METABOLIC PANEL
ALBUMIN: 3.4 g/dL (ref 3.4–5.0)
AST: 18 U/L (ref 15–37)
Alkaline Phosphatase: 106 U/L
Anion Gap: 5 — ABNORMAL LOW (ref 7–16)
BUN: 11 mg/dL (ref 7–18)
Bilirubin,Total: 0.3 mg/dL (ref 0.2–1.0)
CHLORIDE: 104 mmol/L (ref 98–107)
Calcium, Total: 9.1 mg/dL (ref 8.5–10.1)
Co2: 29 mmol/L (ref 21–32)
Creatinine: 0.81 mg/dL (ref 0.60–1.30)
EGFR (African American): >60
EGFR (Non-African Amer.): >60
Glucose: 134 mg/dL — ABNORMAL HIGH (ref 65–99)
OSMOLALITY: 277 (ref 275–301)
POTASSIUM: 3.9 mmol/L (ref 3.5–5.1)
SGPT (ALT): 34 U/L (ref 12–78)
SODIUM: 138 mmol/L (ref 136–145)
Total Protein: 7.2 g/dL (ref 6.4–8.2)

## 2013-09-19 LAB — CBC
HCT: 45.3 % (ref 35.0–47.0)
HGB: 15.2 g/dL (ref 12.0–16.0)
MCH: 30.3 pg (ref 26.0–34.0)
MCHC: 33.6 g/dL (ref 32.0–36.0)
MCV: 90 fL (ref 80–100)
Platelet: 211 10*3/uL (ref 150–440)
RBC: 5.03 10*6/uL (ref 3.80–5.20)
RDW: 13.2 % (ref 11.5–14.5)
WBC: 9.5 10*3/uL (ref 3.6–11.0)

## 2013-09-19 LAB — TROPONIN I: Troponin-I: <0.02 ng/mL

## 2013-09-19 LAB — PRO B NATRIURETIC PEPTIDE: B-Type Natriuretic Peptide: 21 pg/mL (ref 0–125)

## 2013-09-27 ENCOUNTER — Encounter: Payer: Self-pay | Admitting: *Deleted

## 2013-10-13 ENCOUNTER — Encounter: Payer: Self-pay | Admitting: Internal Medicine

## 2013-11-01 ENCOUNTER — Telehealth: Payer: Self-pay | Admitting: *Deleted

## 2013-11-01 NOTE — Telephone Encounter (Signed)
Pt called for allergy list. I emailed to her. KW CMA

## 2013-12-27 ENCOUNTER — Other Ambulatory Visit: Payer: Self-pay | Admitting: Women's Health

## 2013-12-27 ENCOUNTER — Telehealth: Payer: Self-pay | Admitting: Women's Health

## 2013-12-27 MED ORDER — BUPROPION HCL 75 MG PO TABS: ORAL_TABLET | ORAL | Status: DC

## 2013-12-27 NOTE — Telephone Encounter (Signed)
Message copied by Huel Cote on Wed Dec 27, 2013  1:39 PM ------      Message from: Linus Salmons B      Created: Wed Dec 27, 2013 12:42 PM      Regarding: PLS CALL      Contact: [phone]       NANCY,            pls call this patient she called on the dr line. Crying.......... Needs help and wants to talk to you about  Something to help her stop smoking...............Marland Kitcheneveryone fussing at her                  1)Gum did not help and throat felt closing up            2)And patches over the counter had redness around the patch      No matter where she placed it. (it did help her not smoke)      Not sure if she was allergic to it that caused the redness            She knows she is overdue for her ce and got very emotional crying      Due to wt gain...............she said she just wants to talk to you first over phone and will make that ce after she talks to you.............Marland Kitchen                               ------

## 2013-12-27 NOTE — Telephone Encounter (Signed)
Telephone call, wants to try anything to quit smoking, no sleep with chantix, rashes with patch, would like to try Wellbutrin, will try 75 mg once daily in the a.m., evaluating in 2 weeks at annual exam. Reviewed Wellbutrin is similar to chantix, will try lower dose.

## 2013-12-29 ENCOUNTER — Telehealth: Payer: Self-pay | Admitting: *Deleted

## 2013-12-29 MED ORDER — BUPROPION HCL 75 MG PO TABS: ORAL_TABLET | ORAL | Status: DC

## 2013-12-29 NOTE — Telephone Encounter (Signed)
Wellbutrin 75 mg daily x2 weeks then increase to twice daily . #180 with 1 refill.

## 2013-12-29 NOTE — Telephone Encounter (Signed)
[facility] faxed clarification request about Wellbutrin 75 mg #30 with 1 refill. Mail order pharmacist require 3 month supply. Okay to place for 3 months? And the directions were 1 po daily then increase to twice daily. How long will pt take medication daily? She states 2 weeks then increase to twice daily. Please advise

## 2013-12-29 NOTE — Telephone Encounter (Signed)
New rx sent

## 2014-01-17 ENCOUNTER — Ambulatory Visit (INDEPENDENT_AMBULATORY_CARE_PROVIDER_SITE_OTHER): Payer: 59 | Admitting: Women's Health

## 2014-01-17 ENCOUNTER — Encounter: Payer: Self-pay | Admitting: Women's Health

## 2014-01-17 VITALS — BP 124/80 | Ht 65.0 in | Wt 242.0 lb

## 2014-01-17 DIAGNOSIS — F32A Depression, unspecified: Secondary | ICD-10-CM

## 2014-01-17 DIAGNOSIS — F329 Major depressive disorder, single episode, unspecified: Secondary | ICD-10-CM

## 2014-01-17 DIAGNOSIS — F3289 Other specified depressive episodes: Secondary | ICD-10-CM

## 2014-01-17 DIAGNOSIS — Z01419 Encounter for gynecological examination (general) (routine) without abnormal findings: Secondary | ICD-10-CM

## 2014-01-17 MED ORDER — BUPROPION HCL 75 MG PO TABS: ORAL_TABLET | ORAL | Status: DC

## 2014-01-17 NOTE — Patient Instructions (Signed)
Smoking Cessation, Tips for Success If you are ready to quit smoking, congratulations! You have chosen to help yourself be healthier. Cigarettes bring nicotine, tar, carbon monoxide, and other irritants into your body. Your lungs, heart, and blood vessels will be able to work better without these poisons. There are many different ways to quit smoking. Nicotine gum, nicotine patches, a nicotine inhaler, or nicotine nasal spray can help with physical craving. Hypnosis, support groups, and medicines help break the habit of smoking. WHAT THINGS CAN I DO TO MAKE QUITTING EASIER?  Here are some tips to help you quit for good:  Pick a date when you will quit smoking completely. Tell all of your friends and family about your plan to quit on that date.  Do not try to slowly cut down on the number of cigarettes you are smoking. Pick a quit date and quit smoking completely starting on that day.  Throw away all cigarettes.   Clean and remove all ashtrays from your home, work, and car.   On a card, write down your reasons for quitting. Carry the card with you and read it when you get the urge to smoke.   Cleanse your body of nicotine. Drink enough water and fluids to keep your urine clear or pale yellow. Do this after quitting to flush the nicotine from your body.   Learn to predict your moods. Do not let a bad situation be your excuse to have a cigarette. Some situations in your life might tempt you into wanting a cigarette.   Never have "just one" cigarette. It leads to wanting another and another. Remind yourself of your decision to quit.   Change habits associated with smoking. If you smoked while driving or when feeling stressed, try other activities to replace smoking. Stand up when drinking your coffee. Brush your teeth after eating. Sit in a different chair when you read the paper. Avoid alcohol while trying to quit, and try to drink fewer caffeinated beverages. Alcohol and caffeine may urge  you to smoke.   Avoid foods and drinks that can trigger a desire to smoke, such as sugary or spicy foods and alcohol.   Ask people who smoke not to smoke around you.   Have something planned to do right after eating or having a cup of coffee. For example, plan to take a walk or exercise.   Try a relaxation exercise to calm you down and decrease your stress. Remember, you may be tense and nervous for the first 2 weeks after you quit, but this will pass.   Find new activities to keep your hands busy. Play with a pen, coin, or rubber band. Doodle or draw things on paper.   Brush your teeth right after eating. This will help cut down on the craving for the taste of tobacco after meals. You can also try mouthwash.   Use oral substitutes in place of cigarettes. Try using lemon drops, carrots, cinnamon sticks, or chewing gum. Keep them handy so they are available when you have the urge to smoke.   When you have the urge to smoke, try deep breathing.   Designate your home as a nonsmoking area.   If you are a heavy smoker, ask your health care provider about a prescription for nicotine chewing gum. It can ease your withdrawal from nicotine.   Reward yourself. Set aside the cigarette money you save and buy yourself something nice.   Look for support from others. Join a support group or   smoking cessation program. Ask someone at home or at work to help you with your plan to quit smoking.   Always ask yourself, "Do I need this cigarette or is this just a reflex?" Tell yourself, "Today, I choose not to smoke," or "I do not want to smoke." You are reminding yourself of your decision to quit.  Do not replace cigarette smoking with electronic cigarettes (commonly called e-cigarettes). The safety of e-cigarettes is unknown, and some may contain harmful chemicals.  If you relapse, do not give up! Plan ahead and think about what you will do the next time you get the urge to smoke.  HOW WILL  I FEEL WHEN I QUIT SMOKING? You may have symptoms of withdrawal because your body is used to nicotine (the addictive substance in cigarettes). You may crave cigarettes, be irritable, feel very hungry, cough often, get headaches, or have difficulty concentrating. The withdrawal symptoms are only temporary. They are strongest when you first quit but will go away within 10-14 days. When withdrawal symptoms occur, stay in control. Think about your reasons for quitting. Remind yourself that these are signs that your body is healing and getting used to being without cigarettes. Remember that withdrawal symptoms are easier to treat than the major diseases that smoking can cause.  Even after the withdrawal is over, expect periodic urges to smoke. However, these cravings are generally short lived and will go away whether you smoke or not. Do not smoke!  WHAT RESOURCES ARE AVAILABLE TO HELP ME QUIT SMOKING? Your health care provider can direct you to community resources or hospitals for support, which may include:  Group support.  Education.  Hypnosis.  Therapy. Document Released: 04/17/2004 Document Revised: 05/10/2013 Document Reviewed: 01/05/2013 Exit[employer] Patient Information 2015 [employer]. This information is not intended to replace advice given to you by your health care provider. Make sure you discuss any questions you have with your health care provider.  

## 2014-01-17 NOTE — Progress Notes (Signed)
Heidi Pugh Oct 30, 1955 [id number]    History:    Presents for annual exam.  Postmenopausal  no HRT. 2005 TVH with BSOfor endometriosis and dysfunctional uterine bleeding. Struggling to quit smoking currently on Wellbutrin 75 mg once daily will increase to twice daily. Has had problems with Nicorette gum with tongue swelling, skin rash with nicoderm patches, and no sleep on Chantix. Struggles with anxiety and depression on Cymbalta per primary care. Diabetes and hypercholesterolemia managed by primary care. Normal Pap and mammogram history. 2008 negative colonoscopy. Has not had a DEXA. Has tried for years to have for weight loss with diet and exercise unsuccessfully, is excited about prospect of weight loss surgery to help her achieve better health.  Past medical history, past surgical history, family history and social history were all reviewed and documented in the EPIC chart. Numerous family members with  diabetes and hypercholesterolemia.  ROS:  A  12 point ROS was performed and pertinent positives and negatives are included.  Exam:  Filed Vitals:   01/17/14 1019  BP: 124/80    General appearance:  Normal Thyroid:  Symmetrical, normal in size, without palpable masses or nodularity. Respiratory  Auscultation:  Clear without wheezing or rhonchi Cardiovascular  Auscultation:  Regular rate, without rubs, murmurs or gallops  Edema/varicosities:  Not grossly evident Abdominal  Soft,nontender, without masses, guarding or rebound.  Liver/spleen:  No organomegaly noted  Hernia:  None appreciated  Skin  Inspection:  Grossly normal   Breasts: Examined lying and sitting.     Right: Without masses, retractions, discharge or axillary adenopathy.     Left: Without masses, retractions, discharge or axillary adenopathy. Gentitourinary   Inguinal/mons:  Normal without inguinal adenopathy  External genitalia:  Normal  BUS/Urethra/Skene's glands:  Normal  Vagina:  Normal  Cervix:   Absent  Uterus:  Absent  Adnexa/parametria:     Rt: Without masses or tenderness.   Lt: Without masses or tenderness.  Anus and perineum: Normal  Digital rectal exam: Normal sphincter tone without palpated masses or tenderness  Assessment/Plan:  59 y.o. MWF G2P1 for annual exam.    Hypercholesteremia/diabetes-depression-primary care manages labs and meds Smoker trying to quit Obesity - anticiptating weight loss surgery TVH BSO for endometriosis  Plan: Wellbutrin 75 will increase to twice daily, prescription, proper use given and reviewed will continue to work on decreasing number of cigarettes daily, increasing regular exercise. SBE's, annual mammogram, calcium rich diet, vitamin D2000 daily and regular exercise encouraged. DEXA, instructed to schedule.    Past 2 years physicals faxed to [facility] for bariatric surgery per request.   Note: This dictation was prepared with Dragon/digital dictation.  Any transcriptional errors that result are unintentional. Huel Cote [facility], 11:24 AM 01/17/2014

## 2014-01-18 ENCOUNTER — Telehealth: Payer: Self-pay | Admitting: *Deleted

## 2014-01-18 MED ORDER — DULOXETINE HCL 60 MG PO CPEP: ORAL_CAPSULE | ORAL | Status: DC

## 2014-01-18 NOTE — Telephone Encounter (Signed)
Pt forgot to asked for refill cymbalta 60 mg, per nancy young okay to fill for year. rx sent to mail order

## 2014-03-07 ENCOUNTER — Encounter: Payer: Self-pay | Admitting: *Deleted

## 2014-03-07 ENCOUNTER — Other Ambulatory Visit: Payer: Self-pay

## 2014-03-07 ENCOUNTER — Encounter: Payer: Self-pay | Admitting: Internal Medicine

## 2014-03-07 DIAGNOSIS — Z1231 Encounter for screening mammogram for malignant neoplasm of breast: Secondary | ICD-10-CM

## 2014-03-09 ENCOUNTER — Encounter: Payer: Self-pay | Admitting: Women's Health

## 2014-03-09 ENCOUNTER — Ambulatory Visit
Admission: RE | Admit: 2014-03-09 | Discharge: 2014-03-09 | Disposition: A | Discharge status: Home / Self Care | Payer: 59 | Source: Ambulatory Visit

## 2014-03-09 DIAGNOSIS — Z1231 Encounter for screening mammogram for malignant neoplasm of breast: Secondary | ICD-10-CM

## 2014-03-13 ENCOUNTER — Encounter: Payer: Self-pay | Admitting: Women's Health

## 2014-03-13 ENCOUNTER — Telehealth: Payer: Self-pay | Admitting: *Deleted

## 2014-03-13 NOTE — Telephone Encounter (Signed)
Pt called asked if you could review recent blood work scanned in epic and give recommendations. Please advise

## 2014-03-13 NOTE — Telephone Encounter (Signed)
Labs reviewed, Letter sent regarding no need for Pap smear/hysterectomy/normal pap history. Scheduled for gastric sleeve.

## 2014-04-13 ENCOUNTER — Other Ambulatory Visit: Payer: Self-pay | Admitting: *Deleted

## 2014-04-13 MED ORDER — DULOXETINE HCL 60 MG PO CPEP: ORAL_CAPSULE | ORAL | Status: DC

## 2014-04-17 ENCOUNTER — Ambulatory Visit (INDEPENDENT_AMBULATORY_CARE_PROVIDER_SITE_OTHER): Payer: 59 | Admitting: Women's Health

## 2014-04-17 ENCOUNTER — Encounter: Payer: Self-pay | Admitting: Women's Health

## 2014-04-17 DIAGNOSIS — Z87891 Personal history of nicotine dependence: Secondary | ICD-10-CM

## 2014-04-17 DIAGNOSIS — F4323 Adjustment disorder with mixed anxiety and depressed mood: Secondary | ICD-10-CM

## 2014-04-17 DIAGNOSIS — F17210 Nicotine dependence, cigarettes, uncomplicated: Secondary | ICD-10-CM

## 2014-04-17 DIAGNOSIS — F17200 Nicotine dependence, unspecified, uncomplicated: Secondary | ICD-10-CM

## 2014-04-17 MED ORDER — SERTRALINE HCL 20 MG/ML PO CONC
50.0000 mg | Freq: Every day | ORAL | Status: DC
Start: 2014-04-17 — End: 2014-06-07

## 2014-04-17 MED ORDER — SERTRALINE HCL 50 MG PO TABS: 50.0000 mg | ORAL_TABLET | Freq: Every day | ORAL | Status: DC

## 2014-04-17 NOTE — Patient Instructions (Signed)

## 2014-04-17 NOTE — Progress Notes (Signed)
Presents to discuss need for medication change.  Scheduled for gastric sleeve surgery late October 2015.  After surgery  will be on an all liquid diet for several weeks. Has done well on Cymbalta 60mg  but does not come in liquid form, requesting medication similar. Had taken Effexor in the past and had weight gain.  Quit smoking at the end of August 2015, taking Wellbutrin, requesting a nicotine blood test for insurance purposes.    Exam: Appears well.  Depression with anxiety Smoking Cessation  Plan:   Begin taking Cymbalta 60mg  1 tab every other day.  Start taking Zoloft 50mg  1/2 tab qHS for 1 week.  If feeling stable, continue Cymbalta 60mg  1 tab every other day and increase Zoloft 50mg  to 1 tab qHS, and quit Cymbalta following week.     Switch to Zoloft 50 mg,  2.58ml /20mg /mL qHS after surgery.   Nicotine metabolites pending.

## 2014-04-19 ENCOUNTER — Telehealth: Payer: Self-pay | Admitting: *Deleted

## 2014-04-19 NOTE — Telephone Encounter (Signed)
Pharmacy never received Rx for Zoloft 50 mg tablet prescrbied on 04/17/14 OV, I called the Rx for pt and spoke with Barnabas Lister the pharmacist. The Zoloft solution was at pharmacy.

## 2014-04-20 ENCOUNTER — Ambulatory Visit (AMBULATORY_SURGERY_CENTER): Payer: Self-pay

## 2014-04-20 ENCOUNTER — Telehealth: Payer: Self-pay

## 2014-04-20 VITALS — Ht 66.0 in | Wt 250.8 lb

## 2014-04-20 DIAGNOSIS — Z800 Family history of malignant neoplasm of digestive organs: Secondary | ICD-10-CM

## 2014-04-20 MED ORDER — MOVIPREP 100 G PO SOLR: 1.0000 | Freq: Once | ORAL | Status: DC

## 2014-04-20 NOTE — Progress Notes (Signed)
No allergies to eggs or soy No home oxygen No diet/weight loss meds No problems with anesthesia  Has email  Emmi instructions given for colonoscopy

## 2014-04-20 NOTE — Telephone Encounter (Signed)
Patient called to check on nicotine test results. It is not back. I called [facility] [phone] and spoke with Rory. He said turn around time is 3-5 days on that test and it is not final at this time. Patient informed.

## 2014-04-25 ENCOUNTER — Telehealth: Payer: Self-pay | Admitting: Women's Health

## 2014-04-25 NOTE — Telephone Encounter (Signed)
Telephone call, informed nicotine blood test negative

## 2014-04-30 ENCOUNTER — Encounter: Payer: Self-pay | Admitting: Internal Medicine

## 2014-05-04 ENCOUNTER — Encounter: Payer: Self-pay | Admitting: Internal Medicine

## 2014-05-04 ENCOUNTER — Ambulatory Visit (AMBULATORY_SURGERY_CENTER): Payer: 59 | Admitting: Internal Medicine

## 2014-05-04 VITALS — BP 126/72 | HR 68 | Temp 96.5°F | Resp 19 | Ht 66.0 in | Wt 250.0 lb

## 2014-05-04 DIAGNOSIS — Z800 Family history of malignant neoplasm of digestive organs: Secondary | ICD-10-CM

## 2014-05-04 DIAGNOSIS — Z1211 Encounter for screening for malignant neoplasm of colon: Secondary | ICD-10-CM

## 2014-05-04 MED ORDER — SODIUM CHLORIDE 0.9 % IV SOLN: 500.0000 mL | INTRAVENOUS | Status: DC

## 2014-05-04 NOTE — Op Note (Addendum)
[facility]  [address] [place], [address]   COLONOSCOPY PROCEDURE REPORT  PATIENT: Heidi Pugh, Heidi Pugh  MR#: [id number] BIRTHDATE: [birth year] , 57  yrs. old GENDER: female ENDOSCOPIST: Lafayette Dragon, MD REFERRED AO:ZHYQM [place] NP PROCEDURE DATE:  05/04/2014 PROCEDURE:   Colonoscopy, screening First Screening Colonoscopy - Avg.  risk and is 50 yrs.  old or older - No.  Prior Negative Screening - Now for repeat screening. Less than 10 yrs Prior Negative Screening - Now for repeat screening.  Above average risk  History of Adenoma - Now for follow-up colonoscopy & has been > or = to 3 yrs.  N/A  Polyps Removed Today? No.  Polyps Removed Today? No.  Recommend repeat exam, <10 yrs? Polyps Removed Today? No.  Recommend repeat exam, <10 yrs? No. ASA CLASS:   Class II INDICATIONS:father  with colon cancer, llast colonoscopy April 2008 review of ulcer on the ileocecal valve. MEDICATIONS: Monitored anesthesia care and Propofol 250 mg IV  DESCRIPTION OF PROCEDURE:   After the risks benefits and alternatives of the procedure were thoroughly explained, informed consent was obtained.  The digital rectal exam revealed no abnormalities of the rectum.   The LB [id number]  endoscope was introduced through the anus and advanced to the cecum, which was identified by both the appendix and ileocecal valve. No adverse events experienced.   The quality of the prep was good, using MoviPrep  The instrument was then slowly withdrawn as the colon was fully examined.      COLON FINDINGS: A normal appearing cecum, ileocecal valve, and appendiceal orifice were identified.  The ascending, transverse, descending, sigmoid colon, and rectum appeared unremarkable. Retroflexed views revealed no abnormalities. The time to cecum=6 minutes 19 seconds.  Withdrawal time=8 minutes 14 seconds.  The scope was withdrawn and the procedure completed. COMPLICATIONS: There were no immediate  complications.  ENDOSCOPIC IMPRESSION: Normal colonoscopy normal ileo cecal valve  RECOMMENDATIONS: High fiber diet Recall colonoscopy in 5 years  eSigned:  Lafayette Dragon, MD 05/04/2014 12:16 PM Revised: 05/04/2014 12:16 PM  cc:   PATIENT NAME:  Heidi Pugh, Heidi Pugh MR#: [id number]

## 2014-05-04 NOTE — Patient Instructions (Signed)
Impressions/recommendations:  Normal colonoscopy  Repeat colonoscopy in 5 years.  YOU HAD AN ENDOSCOPIC PROCEDURE TODAY AT [facility]: Refer to the procedure report that was given to you for any specific questions about what was found during the examination.  If the procedure report does not answer your questions, please call your gastroenterologist to clarify.  If you requested that your care partner not be given the details of your procedure findings, then the procedure report has been included in a sealed envelope for you to review at your convenience later.  YOU SHOULD EXPECT: Some feelings of bloating in the abdomen. Passage of more gas than usual.  Walking can help get rid of the air that was put into your GI tract during the procedure and reduce the bloating. If you had a lower endoscopy (such as a colonoscopy or flexible sigmoidoscopy) you may notice spotting of blood in your stool or on the toilet paper. If you underwent a bowel prep for your procedure, then you may not have a normal bowel movement for a few days.  DIET: Your first meal following the procedure should be a light meal and then it is ok to progress to your normal diet.  A half-sandwich or bowl of soup is an example of a good first meal.  Heavy or fried foods are harder to digest and may make you feel nauseous or bloated.  Likewise meals heavy in dairy and vegetables can cause extra gas to form and this can also increase the bloating.  Drink plenty of fluids but you should avoid alcoholic beverages for 24 hours.  ACTIVITY: Your care partner should take you home directly after the procedure.  You should plan to take it easy, moving slowly for the rest of the day.  You can resume normal activity the day after the procedure however you should NOT DRIVE or use heavy machinery for 24 hours (because of the sedation medicines used during the test).    SYMPTOMS TO REPORT IMMEDIATELY: A gastroenterologist can be reached  at any hour.  During normal business hours, 8:30 AM to 5:00 PM Monday through Friday, call [phone].  After hours and on weekends, please call the GI answering service at [phone] who will take a message and have the physician on call contact you.   Following lower endoscopy (colonoscopy or flexible sigmoidoscopy):  Excessive amounts of blood in the stool  Significant tenderness or worsening of abdominal pains  Swelling of the abdomen that is new, acute  Fever of 100F or higher   FOLLOW UP: If any biopsies were taken you will be contacted by phone or by letter within the next 1-3 weeks.  Call your gastroenterologist if you have not heard about the biopsies in 3 weeks.  Our staff will call the home number listed on your records the next business day following your procedure to check on you and address any questions or concerns that you may have at that time regarding the information given to you following your procedure. This is a courtesy call and so if there is no answer at the home number and we have not heard from you through the emergency physician on call, we will assume that you have returned to your regular daily activities without incident.  SIGNATURES/CONFIDENTIALITY: You and/or your care partner have signed paperwork which will be entered into your electronic medical record.  These signatures attest to the fact that that the information above on your After Visit Summary has been  reviewed and is understood.  Full responsibility of the confidentiality of this discharge information lies with you and/or your care-partner. 

## 2014-05-04 NOTE — Progress Notes (Signed)
Report to PACU, RN, vss, BBS= Clear.  

## 2014-05-07 ENCOUNTER — Telehealth: Payer: Self-pay | Admitting: *Deleted

## 2014-05-07 NOTE — Telephone Encounter (Signed)
  Follow up Call-  Call back number 05/04/2014  Post procedure Call Back phone  # [phone]  Permission to leave phone message Yes     Patient questions:  Do you have a fever, pain , or abdominal swelling? No. Pain Score  0 *  Have you tolerated food without any problems? Yes.    Have you been able to return to your normal activities? Yes.    Do you have any questions about your discharge instructions: Diet   No. Medications  No. Follow up visit  No.  Do you have questions or concerns about your Care? No.  Actions: * If pain score is 4 or above: No action needed, pain <4.

## 2014-05-08 ENCOUNTER — Other Ambulatory Visit: Payer: Self-pay

## 2014-05-08 DIAGNOSIS — F329 Major depressive disorder, single episode, unspecified: Secondary | ICD-10-CM

## 2014-05-08 DIAGNOSIS — F32A Depression, unspecified: Secondary | ICD-10-CM

## 2014-05-17 ENCOUNTER — Telehealth: Payer: Self-pay

## 2014-05-17 NOTE — Telephone Encounter (Signed)
i will call the pharmacist, it is confusing, she is having gastric sleeve, needs liguid meds.  What is the #?

## 2014-05-17 NOTE — Telephone Encounter (Signed)
Pharmacy called with questions about Rx's that you sent.  Duloxetine says daily for 2 mos but is authorized for one year.  Zoloft says 2 mos only but has a refill on it.  They want to know #1 Does she take Zoloft first and later takes Duloxetine or is she taking them at the same time? #2 How long should they be authorized for in regards to refills/time she will be taking them. #3 Need to verify supervising MD info.

## 2014-05-18 NOTE — Telephone Encounter (Signed)
Thanks!!  # is [phone].

## 2014-05-18 NOTE — Telephone Encounter (Signed)
Telephone call to pharmacist prescriptions reviewed and Zoloft liquid will  be sent.

## 2014-05-21 ENCOUNTER — Telehealth: Payer: Self-pay | Admitting: *Deleted

## 2014-05-21 NOTE — Telephone Encounter (Signed)
Pt called very upset stating her Rx for Wellbutrin 75 mg was not at pharmacy. Pt left a very upset message in traige voicemail, I called [facility] to make sure Rx was at the pharmacy first and it was being filled as I spoke to pharmacist. The pharmacist also said pt called her and she told pt that Rx will be filled. apparently the was a issue with medication and mail order pharmacy. I called pt back and she was crying stating the medication for smoking. I explained to pt Rx is ready for pick.

## 2014-06-04 ENCOUNTER — Encounter: Payer: Self-pay | Admitting: Internal Medicine

## 2014-06-07 ENCOUNTER — Telehealth: Payer: Self-pay | Admitting: *Deleted

## 2014-06-07 ENCOUNTER — Other Ambulatory Visit: Payer: Self-pay

## 2014-06-07 DIAGNOSIS — F4323 Adjustment disorder with mixed anxiety and depressed mood: Secondary | ICD-10-CM

## 2014-06-07 DIAGNOSIS — F32A Depression, unspecified: Secondary | ICD-10-CM

## 2014-06-07 DIAGNOSIS — F329 Major depressive disorder, single episode, unspecified: Secondary | ICD-10-CM

## 2014-06-07 MED ORDER — BUPROPION HCL 75 MG PO TABS: ORAL_TABLET | ORAL | Status: DC

## 2014-06-07 MED ORDER — SERTRALINE HCL 50 MG PO TABS: 50.0000 mg | ORAL_TABLET | Freq: Every day | ORAL | Status: DC

## 2014-06-07 NOTE — Telephone Encounter (Signed)
Pt requested that her Wellbutrin 75mg  2pills a day and Zoloft (generic) 50mg  1 a day. Pt requested pill instead of liquid because too much water with the liquid sertaline concentration and with surgery it was too much. Rxs sent to mail order [facility]

## 2015-01-01 ENCOUNTER — Emergency Department (HOSPITAL_COMMUNITY): Payer: 59

## 2015-01-01 ENCOUNTER — Inpatient Hospital Stay (HOSPITAL_COMMUNITY): Payer: 59

## 2015-01-01 ENCOUNTER — Encounter (HOSPITAL_COMMUNITY): Payer: Self-pay | Admitting: Emergency Medicine

## 2015-01-01 ENCOUNTER — Inpatient Hospital Stay (HOSPITAL_COMMUNITY)
Admission: EM | Admit: 2015-01-01 | Discharge: 2015-01-03 | DRG: 446 | Disposition: A | Discharge status: Home / Self Care | Payer: 59 | Attending: Internal Medicine | Admitting: Internal Medicine

## 2015-01-01 DIAGNOSIS — F419 Anxiety disorder, unspecified: Secondary | ICD-10-CM | POA: Diagnosis present

## 2015-01-01 DIAGNOSIS — K219 Gastro-esophageal reflux disease without esophagitis: Secondary | ICD-10-CM | POA: Diagnosis present

## 2015-01-01 DIAGNOSIS — R1011 Right upper quadrant pain: Secondary | ICD-10-CM | POA: Diagnosis not present

## 2015-01-01 DIAGNOSIS — K8051 Calculus of bile duct without cholangitis or cholecystitis with obstruction: Secondary | ICD-10-CM | POA: Diagnosis present

## 2015-01-01 DIAGNOSIS — Z885 Allergy status to narcotic agent status: Secondary | ICD-10-CM | POA: Diagnosis not present

## 2015-01-01 DIAGNOSIS — K838 Other specified diseases of biliary tract: Secondary | ICD-10-CM | POA: Diagnosis not present

## 2015-01-01 DIAGNOSIS — Z880 Allergy status to penicillin: Secondary | ICD-10-CM

## 2015-01-01 DIAGNOSIS — Z716 Tobacco abuse counseling: Secondary | ICD-10-CM | POA: Diagnosis present

## 2015-01-01 DIAGNOSIS — Z9842 Cataract extraction status, left eye: Secondary | ICD-10-CM

## 2015-01-01 DIAGNOSIS — G4733 Obstructive sleep apnea (adult) (pediatric): Secondary | ICD-10-CM | POA: Diagnosis present

## 2015-01-01 DIAGNOSIS — Z90710 Acquired absence of both cervix and uterus: Secondary | ICD-10-CM | POA: Diagnosis not present

## 2015-01-01 DIAGNOSIS — E7800 Pure hypercholesterolemia, unspecified: Secondary | ICD-10-CM | POA: Diagnosis present

## 2015-01-01 DIAGNOSIS — R740 Nonspecific elevation of levels of transaminase and lactic acid dehydrogenase [LDH]: Secondary | ICD-10-CM

## 2015-01-01 DIAGNOSIS — Z8639 Personal history of other endocrine, nutritional and metabolic disease: Secondary | ICD-10-CM | POA: Diagnosis not present

## 2015-01-01 DIAGNOSIS — E669 Obesity, unspecified: Secondary | ICD-10-CM | POA: Diagnosis present

## 2015-01-01 DIAGNOSIS — R51 Headache: Secondary | ICD-10-CM | POA: Diagnosis not present

## 2015-01-01 DIAGNOSIS — R945 Abnormal results of liver function studies: Secondary | ICD-10-CM

## 2015-01-01 DIAGNOSIS — R739 Hyperglycemia, unspecified: Secondary | ICD-10-CM | POA: Diagnosis not present

## 2015-01-01 DIAGNOSIS — F329 Major depressive disorder, single episode, unspecified: Secondary | ICD-10-CM | POA: Diagnosis present

## 2015-01-01 DIAGNOSIS — Z888 Allergy status to other drugs, medicaments and biological substances status: Secondary | ICD-10-CM

## 2015-01-01 DIAGNOSIS — K8050 Calculus of bile duct without cholangitis or cholecystitis without obstruction: Secondary | ICD-10-CM | POA: Diagnosis present

## 2015-01-01 DIAGNOSIS — I10 Essential (primary) hypertension: Secondary | ICD-10-CM | POA: Diagnosis present

## 2015-01-01 DIAGNOSIS — K831 Obstruction of bile duct: Secondary | ICD-10-CM | POA: Diagnosis not present

## 2015-01-01 DIAGNOSIS — Z886 Allergy status to analgesic agent status: Secondary | ICD-10-CM | POA: Diagnosis not present

## 2015-01-01 DIAGNOSIS — Z9884 Bariatric surgery status: Secondary | ICD-10-CM | POA: Diagnosis not present

## 2015-01-01 DIAGNOSIS — M1990 Unspecified osteoarthritis, unspecified site: Secondary | ICD-10-CM | POA: Diagnosis present

## 2015-01-01 DIAGNOSIS — Z6829 Body mass index (BMI) 29.0-29.9, adult: Secondary | ICD-10-CM

## 2015-01-01 DIAGNOSIS — E1165 Type 2 diabetes mellitus with hyperglycemia: Secondary | ICD-10-CM | POA: Diagnosis present

## 2015-01-01 DIAGNOSIS — D696 Thrombocytopenia, unspecified: Secondary | ICD-10-CM | POA: Diagnosis present

## 2015-01-01 DIAGNOSIS — Z9841 Cataract extraction status, right eye: Secondary | ICD-10-CM | POA: Diagnosis not present

## 2015-01-01 DIAGNOSIS — R7989 Other specified abnormal findings of blood chemistry: Secondary | ICD-10-CM

## 2015-01-01 DIAGNOSIS — R1013 Epigastric pain: Secondary | ICD-10-CM | POA: Insufficient documentation

## 2015-01-01 DIAGNOSIS — R109 Unspecified abdominal pain: Secondary | ICD-10-CM

## 2015-01-01 DIAGNOSIS — F17210 Nicotine dependence, cigarettes, uncomplicated: Secondary | ICD-10-CM | POA: Diagnosis present

## 2015-01-01 DIAGNOSIS — E785 Hyperlipidemia, unspecified: Secondary | ICD-10-CM | POA: Diagnosis present

## 2015-01-01 DIAGNOSIS — E119 Type 2 diabetes mellitus without complications: Secondary | ICD-10-CM | POA: Diagnosis not present

## 2015-01-01 DIAGNOSIS — R7401 Elevation of levels of liver transaminase levels: Secondary | ICD-10-CM | POA: Diagnosis present

## 2015-01-01 DIAGNOSIS — F418 Other specified anxiety disorders: Secondary | ICD-10-CM | POA: Diagnosis not present

## 2015-01-01 HISTORY — DX: Adverse effect of unspecified anesthetic, initial encounter: T41.45XA

## 2015-01-01 HISTORY — DX: Ulcer of intestine: K63.3

## 2015-01-01 HISTORY — DX: Gastro-esophageal reflux disease without esophagitis: K21.9

## 2015-01-01 HISTORY — DX: Other complications of anesthesia, initial encounter: T88.59XA

## 2015-01-01 HISTORY — DX: Other specified anxiety disorders: F41.8

## 2015-01-01 LAB — CBC WITH DIFFERENTIAL/PLATELET
Basophils Absolute: 0 10*3/uL (ref 0.0–0.1)
Basophils Relative: 0 % (ref 0–1)
EOS ABS: 0.1 10*3/uL (ref 0.0–0.7)
Eosinophils Relative: 1 % (ref 0–5)
HEMATOCRIT: 43.9 % (ref 36.0–46.0)
HEMOGLOBIN: 14.6 g/dL (ref 12.0–15.0)
LYMPHS ABS: 3.1 10*3/uL (ref 0.7–4.0)
Lymphocytes Relative: 36 % (ref 12–46)
MCH: 30.4 pg (ref 26.0–34.0)
MCHC: 33.3 g/dL (ref 30.0–36.0)
MCV: 91.3 fL (ref 78.0–100.0)
MONOS PCT: 5 % (ref 3–12)
Monocytes Absolute: 0.4 10*3/uL (ref 0.1–1.0)
Neutro Abs: 5.1 10*3/uL (ref 1.7–7.7)
Neutrophils Relative %: 58 % (ref 43–77)
Platelets: 182 10*3/uL (ref 150–400)
RBC: 4.81 MIL/uL (ref 3.87–5.11)
RDW: 13.3 % (ref 11.5–15.5)
WBC: 8.7 10*3/uL (ref 4.0–10.5)

## 2015-01-01 LAB — COMPREHENSIVE METABOLIC PANEL
ALBUMIN: 3.8 g/dL (ref 3.5–5.0)
ALK PHOS: 213 U/L — AB (ref 38–126)
ALT: 91 U/L — ABNORMAL HIGH (ref 14–54)
ANION GAP: 9 (ref 5–15)
AST: 203 U/L — ABNORMAL HIGH (ref 15–41)
BILIRUBIN TOTAL: 0.6 mg/dL (ref 0.3–1.2)
BUN: 16 mg/dL (ref 6–20)
CO2: 29 mmol/L (ref 22–32)
CREATININE: 0.68 mg/dL (ref 0.44–1.00)
Calcium: 9.6 mg/dL (ref 8.9–10.3)
Chloride: 105 mmol/L (ref 101–111)
GFR calc non Af Amer: >60 mL/min (ref >60)
GLUCOSE: 146 mg/dL — AB (ref 65–99)
POTASSIUM: 4.2 mmol/L (ref 3.5–5.1)
Sodium: 143 mmol/L (ref 135–145)
Total Protein: 6.3 g/dL — ABNORMAL LOW (ref 6.5–8.1)

## 2015-01-01 LAB — URINALYSIS, ROUTINE W REFLEX MICROSCOPIC
BILIRUBIN URINE: NEGATIVE
Glucose, UA: NEGATIVE mg/dL
Hgb urine dipstick: NEGATIVE
KETONES UR: NEGATIVE mg/dL
Nitrite: NEGATIVE
PH: 5 (ref 5.0–8.0)
Protein, ur: NEGATIVE mg/dL
Specific Gravity, Urine: 1.014 (ref 1.005–1.030)
Urobilinogen, UA: 1 mg/dL (ref 0.0–1.0)

## 2015-01-01 LAB — TROPONIN I: Troponin I: <0.03 ng/mL (ref <0.031)

## 2015-01-01 LAB — URINE MICROSCOPIC-ADD ON

## 2015-01-01 LAB — LIPASE, BLOOD: LIPASE: 24 U/L (ref 22–51)

## 2015-01-01 MED ORDER — GADOBENATE DIMEGLUMINE 529 MG/ML IV SOLN
15.0000 mL | Freq: Once | INTRAVENOUS | Status: AC | PRN
  Administered 2015-01-01: 15 mL via INTRAVENOUS

## 2015-01-01 MED ORDER — INSULIN ASPART 100 UNIT/ML ~~LOC~~ SOLN: 0.0000 [IU] | Freq: Three times a day (TID) | SUBCUTANEOUS | Status: DC

## 2015-01-01 MED ORDER — ONDANSETRON HCL 4 MG/2ML IJ SOLN
4.0000 mg | Freq: Four times a day (QID) | INTRAMUSCULAR | Status: DC | PRN
  Administered 2015-01-01 (×2): 4 mg via INTRAVENOUS
  Filled 2015-01-01 (×2): qty 2

## 2015-01-01 MED ORDER — IOHEXOL 300 MG/ML  SOLN
25.0000 mL | Freq: Once | INTRAMUSCULAR | Status: AC | PRN
  Administered 2015-01-01: 25 mL via ORAL

## 2015-01-01 MED ORDER — SODIUM CHLORIDE 0.9 % IV SOLN
INTRAVENOUS | Status: DC
  Administered 2015-01-01 – 2015-01-02 (×3): via INTRAVENOUS

## 2015-01-01 MED ORDER — HYDROMORPHONE HCL 1 MG/ML IJ SOLN
1.0000 mg | Freq: Once | INTRAMUSCULAR | Status: AC
  Administered 2015-01-01: 1 mg via INTRAVENOUS
  Filled 2015-01-01: qty 1

## 2015-01-01 MED ORDER — GI COCKTAIL ~~LOC~~
30.0000 mL | Freq: Once | ORAL | Status: AC
  Administered 2015-01-01: 30 mL via ORAL
  Filled 2015-01-01: qty 30

## 2015-01-01 MED ORDER — BUPROPION HCL 75 MG PO TABS
75.0000 mg | ORAL_TABLET | Freq: Two times a day (BID) | ORAL | Status: DC
  Administered 2015-01-01 – 2015-01-03 (×6): 75 mg via ORAL
  Filled 2015-01-01 (×7): qty 1

## 2015-01-01 MED ORDER — SODIUM CHLORIDE 0.9 % IV BOLUS (SEPSIS)
500.0000 mL | Freq: Once | INTRAVENOUS | Status: AC
  Administered 2015-01-01: 500 mL via INTRAVENOUS

## 2015-01-01 MED ORDER — ADULT MULTIVITAMIN W/MINERALS CH
1.0000 | ORAL_TABLET | Freq: Every day | ORAL | Status: DC
  Administered 2015-01-02 – 2015-01-03 (×2): 1 via ORAL
  Filled 2015-01-01 (×2): qty 1

## 2015-01-01 MED ORDER — SERTRALINE HCL 50 MG PO TABS
50.0000 mg | ORAL_TABLET | Freq: Every day | ORAL | Status: DC
  Administered 2015-01-01 – 2015-01-02 (×2): 50 mg via ORAL
  Filled 2015-01-01 (×2): qty 1

## 2015-01-01 MED ORDER — ONDANSETRON HCL 4 MG/2ML IJ SOLN
4.0000 mg | Freq: Once | INTRAMUSCULAR | Status: AC
  Administered 2015-01-01: 4 mg via INTRAVENOUS
  Filled 2015-01-01: qty 2

## 2015-01-01 MED ORDER — ENOXAPARIN SODIUM 40 MG/0.4ML ~~LOC~~ SOLN
40.0000 mg | SUBCUTANEOUS | Status: DC
  Administered 2015-01-01: 40 mg via SUBCUTANEOUS
  Filled 2015-01-01: qty 0.4

## 2015-01-01 MED ORDER — HYDROMORPHONE HCL 1 MG/ML IJ SOLN
1.0000 mg | INTRAMUSCULAR | Status: DC | PRN
  Administered 2015-01-01 – 2015-01-02 (×6): 1 mg via INTRAVENOUS
  Filled 2015-01-01 (×7): qty 1

## 2015-01-01 MED ORDER — IOHEXOL 300 MG/ML  SOLN: 80.0000 mL | Freq: Once | INTRAMUSCULAR | Status: AC | PRN

## 2015-01-01 NOTE — ED Provider Notes (Signed)
Patient seen and evaluated. Studies reviewed. I discussed the case with, and accepted care from Dr. Tawnya Crook.  Patient with elevated transaminases. Not febrile, no leukocytosis, no chills. Does not appear toxic like cholangitis.  Ultrasound, and CT confirmed choledocholithiasis, with dilated bile ducts. No acute cholecystitis.  Discussed with try hospitalist, and GI. Patient would possibly require ERCP. However, has history of gastric sleeve bypass.  Pain has become much improved and has not required pain medication for the last several hours. Recheck temperature is normal.. Seen by internal medicine resident in the emergency room.  Tanna Furry, MD 01/01/15 [phone]

## 2015-01-01 NOTE — ED Notes (Signed)
Internal med at bedside.

## 2015-01-01 NOTE — ED Notes (Signed)
Pt. reports mid abdominal pain onset this evening , denies nausea or vomitting , no diarrhea or fever .

## 2015-01-01 NOTE — ED Notes (Signed)
Pt states that abdominal pain is back and as bad as before, 10/10

## 2015-01-01 NOTE — ED Notes (Signed)
Report given to 6N RN. 

## 2015-01-01 NOTE — ED Provider Notes (Signed)
CSN: [id number]     Arrival date & time 01/01/15  0110 History  This chart was scribed for Ernestina Patches, MD by Randa Evens, ED Scribe. This patient was seen in room A12C/A12C and the patient's care was started at 3:03 AM.      Chief Complaint  Patient presents with  . Abdominal Pain    Patient is a 59 y.o. female presenting with abdominal pain. The history is provided by the patient. No language interpreter was used.  Abdominal Pain Pain location:  RUQ and epigastric Pain quality: stabbing   Pain radiates to:  Does not radiate Pain severity:  Severe Onset quality:  Sudden Duration:  3 hours Timing:  Intermittent Chronicity:  New Relieved by:  None tried Worsened by:  Nothing tried Ineffective treatments:  Antacids Associated symptoms: no chest pain, no chills, no cough, no diarrhea, no dysuria, no fatigue, no fever, no nausea, no shortness of breath, no sore throat and no vomiting   Risk factors: multiple surgeries    HPI Comments: Heidi Pugh is a 59 y.o. female who presents to the Emergency Department complaining of new intermittent stabbing abdominal pain onset 3 hours prior. Pt states she woke up with acid reflux and  has tried Tums with slight relief of the acid reflux. Pt reports gastric sleeve bypass 6 moths prior. She states she has lost 111 pounds and denies any post operative complications. Pt states that she has had a normal BM yesterday. Denies back pain, nausea, vomiting, diarrhea, fever or dysuria. Denies Hx of gallstones. Reports hx of hysterectomy and appendectomy.   Past Medical History  Diagnosis Date  . Pneumonia 10/2006  . Gastrointestinal ulcer 10/2006  . Diabetes mellitus   . High cholesterol   . Cataracts, bilateral   . Sleep apnea     cpap every night  . Anxiety   . Depression   . Arthritis     knees bilat   Past Surgical History  Procedure Laterality Date  . Tubal ligation    . Hysteroscopy      D & C  . Oophorectomy      BSO  .  Cataract extraction    . Abdominal hysterectomy  2005    LAVH-BSO,, MENORRHAGIA AND PELVIC PAIN  . Appendectomy    . Tonsillectomy    . Gastric bypass     Family History  Problem Relation Age of Onset  . Colon cancer Father   . Diabetes Sister   . Diabetes Sister   . Prostate cancer Father    History  Substance Use Topics  . Smoking status: Former Smoker    Quit date: 03/26/2014  . Smokeless tobacco: Never Used  . Alcohol Use: Yes     Comment: rare; one glass per year   OB History    Gravida Para Term Preterm AB TAB SAB Ectopic Multiple Living   2 1        1      Review of Systems  Constitutional: Negative for fever, chills, diaphoresis, activity change, appetite change and fatigue.  HENT: Negative for congestion, facial swelling, rhinorrhea and sore throat.   Eyes: Negative for photophobia and discharge.  Respiratory: Negative for cough, chest tightness and shortness of breath.   Cardiovascular: Negative for chest pain, palpitations and leg swelling.  Gastrointestinal: Positive for abdominal pain. Negative for nausea, vomiting and diarrhea.  Endocrine: Negative for polydipsia and polyuria.  Genitourinary: Negative for dysuria, frequency, difficulty urinating and pelvic pain.  Musculoskeletal: Negative for  back pain, arthralgias, neck pain and neck stiffness.  Skin: Negative for color change and wound.  Allergic/Immunologic: Negative for immunocompromised state.  Neurological: Negative for facial asymmetry, weakness, numbness and headaches.  Hematological: Does not bruise/bleed easily.  Psychiatric/Behavioral: Negative for confusion and agitation.     Allergies  Aspirin; Darvocet; Etodolac; Flagyl; Hydrocodone-acetaminophen; Nsaids; Terazol; Ultram; Valium; Codeine; and Penicillins  Home Medications   Prior to Admission medications   Medication Sig Start Date End Date Taking? Authorizing Provider  buPROPion [facility]) 75 MG tablet Take 2 pills a day 06/07/14  Yes  Huel Cote, NP  Multiple Vitamin (MULTIVITAMIN WITH MINERALS) TABS tablet Take 1 tablet by mouth daily.   Yes Historical Provider, MD  sertraline (ZOLOFT) 50 MG tablet Take 1 tablet (50 mg total) by mouth daily. 06/07/14  Yes Huel Cote, NP   BP 100/57 mmHg  Pulse 67  Temp(Src) 97.8 F (36.6 C) (Oral)  Resp 13  Ht 5\' 5"  (1.651 m)  Wt 151 lb (68.493 kg)  BMI 25.13 kg/m2  SpO2 99%   Physical Exam  Constitutional: She is oriented to person, place, and time. She appears well-developed and well-nourished.  uncomfortable appearing.   HENT:  Head: Normocephalic.  Mouth/Throat: Oropharynx is clear and moist.  Eyes: Pupils are equal, round, and reactive to light.  Neck: Neck supple.  Cardiovascular: Normal rate, regular rhythm and normal heart sounds.   Pulmonary/Chest: Effort normal and breath sounds normal. No respiratory distress. She has no wheezes.  Abdominal: Soft. Bowel sounds are normal. She exhibits no distension. There is tenderness in the right upper quadrant and epigastric area. There is no rebound and no guarding.  Musculoskeletal: She exhibits no edema or tenderness.  Neurological: She is alert and oriented to person, place, and time.  Skin: Skin is warm and dry.  Psychiatric: She has a normal mood and affect.  Nursing note and vitals reviewed.   ED Course  Procedures (including critical care time) DIAGNOSTIC STUDIES: Oxygen Saturation is 99% on RA, normal by my interpretation.    COORDINATION OF CARE: 3:12 AM-Discussed treatment plan with pt at bedside and pt agreed to plan.     Labs Review Labs Reviewed  COMPREHENSIVE METABOLIC PANEL - Abnormal; Notable for the following:    Glucose, Bld 146 (*)    Total Protein 6.3 (*)    AST 203 (*)    ALT 91 (*)    Alkaline Phosphatase 213 (*)    All other components within normal limits  URINALYSIS, ROUTINE W REFLEX MICROSCOPIC (NOT AT [facility]) - Abnormal; Notable for the following:    Leukocytes, UA TRACE (*)    All  other components within normal limits  URINE MICROSCOPIC-ADD ON - Abnormal; Notable for the following:    Squamous Epithelial / LPF MANY (*)    Bacteria, UA FEW (*)    All other components within normal limits  CBC WITH DIFFERENTIAL/PLATELET  LIPASE, BLOOD    Imaging Review No results found.   EKG Interpretation None      MDM   Final diagnoses:  Epigastric pain   Pt is a 59 y.o. female with Pmhx as above who presents with sudden onset severe central abdominal pain described as stabbing pain since about 2 AM this morning.  Pain is waxing and waning without radiation.  She's had associated nausea and vomiting.  She had a normal bowel movement around time of onset of pain.  She denies fevers or chills.  She had a Gastric bypass in  October, at the [facility] in [place]. On Exa, pt uncomfortable, +epigastric & RUQ ttp. LFTs mildly elevated. [place] with Sludge, "flaring of the distal CBD". I spoke to Dr. Marlou Starks gen surg, who rec CT to w/o internal hernia, then call back. Dr. Jeneen Rinks will follow up on CT. Pain has ben difficult to control at this point, thought VSS.     I personally performed the services described in this documentation, which was scribed in my presence. The recorded information has been reviewed and is accurate.       Ernestina Patches, MD 01/01/15 [phone]

## 2015-01-01 NOTE — ED Notes (Signed)
Having dry heaves-- zofran given.

## 2015-01-01 NOTE — H&P (Signed)
Date: 01/01/2015               Patient Name:  Heidi Pugh MRN: [id number]  DOB: 04/17/1956 Age / Sex: 59 y.o., female   PCP: Heidi Cote, NP         Medical Service: Internal Medicine Teaching Service         Attending Physician: Dr. Beryle Beams     First Contact: Dr. Trudee Kuster Pager: [phone]  Second Contact: Dr. Gordy Levan Pager: [phone]       After Hours (After 5p/  First Contact Pager: [phone]  weekends / holidays): Second Contact Pager: [phone]   Chief Complaint: Abdominal pain   History of Present Illness:  59 y.o. pna pneumonia, GI ulcer, DM 2, obesity, HLD,b/l cataracts, OSA on cpap, anxiety/depression, arthritis, s/p gastric sleeve bypass 6 months ago (05/2014-[facility] Dr. Volanda Napoleon). She presented for RUQ,  Epigastric, RLQ abdominal pain since 01/01/15 at midnight.  Abdominal pain is intermittent stabbing >10/10.   She states the abdominal pain was generalized but now epigastric, RUQ, RLQ.  Pain hurt so bad it took her breath away.  Pain has gone down to 3-5/10 with medications given in the ED (Diluadid 1 mg x 3, Zofran 4 mg x 1, NS 500 cc bolus).  She has not tried any medications and pain is not worsened by anything.  She tried different positions at home w/o relief of pain.   Abdominal pain is associated with nausea and dry heaves but not fever.      Meds: Current Facility-Administered Medications  Medication Dose Route Frequency Provider Last Rate Last Dose  . iohexol (OMNIPAQUE) 300 MG/ML solution 80 mL  80 mL Intravenous Once PRN Medication Radiologist, MD       Current Outpatient Prescriptions  Medication Sig Dispense Refill  . buPROPion (WELLBUTRIN) 75 MG tablet Take 2 pills a day 180 tablet 3  . Multiple Vitamin (MULTIVITAMIN WITH MINERALS) TABS tablet Take 1 tablet by mouth daily.    . sertraline (ZOLOFT) 50 MG tablet Take 1 tablet (50 mg total) by mouth daily. 90 tablet 3    Allergies: Allergies as of 01/01/2015 - Review Complete 01/01/2015  Allergen  Reaction Noted  . Aspirin Other (See Comments) 01/08/2011  . Darvocet [propoxyphene n-acetaminophen] Other (See Comments) 01/08/2011  . Etodolac Other (See Comments) 01/08/2011  . Flagyl [metronidazole hcl] Other (See Comments) 01/08/2011  . Hydrocodone-acetaminophen Other (See Comments) 01/08/2011  . Nsaids Other (See Comments) 01/08/2011  . Terazol [terconazole] Other (See Comments) 01/08/2011  . Ultram [tramadol hcl] Other (See Comments) 01/08/2011  . Valium Other (See Comments) 01/08/2011  . Codeine Rash 01/08/2011  . Penicillins Rash 08/05/2011   Past Medical History  Diagnosis Date  . Pneumonia 10/2006  . Gastrointestinal ulcer 10/2006  . Diabetes mellitus   . High cholesterol   . Cataracts, bilateral   . Sleep apnea     cpap every night  . Anxiety   . Depression   . Arthritis     knees bilat   Past Surgical History  Procedure Laterality Date  . Tubal ligation    . Hysteroscopy      D & C  . Oophorectomy      BSO  . Cataract extraction    . Abdominal hysterectomy  2005    LAVH-BSO,, MENORRHAGIA AND PELVIC PAIN  . Appendectomy    . Tonsillectomy    . Gastric bypass     Family History  Problem Relation Age of Onset  .  Colon cancer Father   . Diabetes Sister   . Diabetes Sister   . Prostate cancer Father    History   Social History  . Marital Status: Married    Spouse Name: N/A  . Number of Children: N/A  . Years of Education: N/A   Occupational History  . Not on file.   Social History Main Topics  . Smoking status: Former Smoker    Quit date: 03/26/2014  . Smokeless tobacco: Never Used  . Alcohol Use: Yes     Comment: rare; one glass per year  . Drug Use: No  . Sexual Activity: Not Currently   Other Topics Concern  . Not on file   Social History Narrative    Review of Systems: General: denies fever, recently lost 10 lbs in 1 week, +dry mouth, +hot flashes  HEENT: resolved h/a (right temporal) noted yesterday Cardiac:denies CP Pulm:  denies sob Abd: +ab pain, +nausea, +dry heaves, denies constipation  Ext: denies leg swelling Neuro: resolved h/a    Physical Exam: Blood pressure 106/86, pulse 74, temperature 97.8 F (36.6 C), temperature source Oral, resp. rate 14, height 5\' 5"  (1.651 m), weight 151 lb (68.493 kg), SpO2 95 % room air. Vitals reviewed. General: resting in bed, NAD HEENT: PERRL b/l, [place]/at, no scleral icterus Cardiac: RRR, no rubs, murmurs or gallops Pulm: clear to auscultation bilaterally, no wheezes, rales, or rhonchi Abd: soft, mod to severe ttp RUQ, epigastric, RLQ, nondistended, BS present Ext: warm and well perfused, no pedal edema Neuro: alert and oriented X3, cranial nerves II-XII grossly intact, moving all 4 extremities    Lab results: Basic Metabolic Panel:  Recent Labs  01/01/15 0132  NA 143  K 4.2  CL 105  CO2 29  GLUCOSE 146*  BUN 16  CREATININE 0.68  CALCIUM 9.6   Liver Function Tests:  Recent Labs  01/01/15 0132  AST 203*  ALT 91*  ALKPHOS 213*  BILITOT 0.6  PROT 6.3*  ALBUMIN 3.8    Recent Labs  01/01/15 0132  LIPASE 24   CBC:  Recent Labs  01/01/15 0132  WBC 8.7  NEUTROABS 5.1  HGB 14.6  HCT 43.9  MCV 91.3  PLT 182   Urinalysis:  Recent Labs  01/01/15 0127  COLORURINE YELLOW  LABSPEC 1.014  PHURINE 5.0  GLUCOSEU NEGATIVE  HGBUR NEGATIVE  BILIRUBINUR NEGATIVE  KETONESUR NEGATIVE  PROTEINUR NEGATIVE  UROBILINOGEN 1.0  NITRITE NEGATIVE  LEUKOCYTESUR TRACE*   Misc. Labs: HA1C   Imaging results:  US Abdomen Complete  01/01/2015   CLINICAL DATA:  Epigastric pain since this evening.  EXAM: ULTRASOUND ABDOMEN COMPLETE  COMPARISON:  None.  FINDINGS: Gallbladder: Physiologically distended with sludge. No gallstones or wall thickening visualized. No sonographic Murphy sign noted.  Common bile duct: Diameter: 5.3 mm proximally, flaring distally measuring 7.6 mm.  Liver: No focal lesion identified. Within normal limits in parenchymal  echogenicity. There is no intrahepatic biliary ductal dilatation.  IVC: No abnormality visualized.  Pancreas: Visualized portion unremarkable. Pancreatic duct is visible however normal in caliber measuring loss in 2 mm.  Spleen: Size and appearance within normal limits.  Right Kidney: Length: 12.0 cm. Echogenicity within normal limits. No mass or hydronephrosis visualized.  Left Kidney: Length: 10.3 cm. Echogenicity within normal limits. No mass or hydronephrosis visualized.  Abdominal aorta: No aneurysm visualized.  Other findings: None.  IMPRESSION: 1. Physiologically distended gallbladder containing sludge. No stones or findings of acute cholecystitis. 2. Flaring of the distal common bile  duct, more proximal duct is normal in caliber. This is likely physiologic in this patient. Correlation with LFTs recommended.   Electronically Signed   By: Jeb Levering M.D.   On: 01/01/2015 04:09   Ct Abdomen Pelvis W Contrast  01/01/2015   CLINICAL DATA:  Severe abdominal pain since yesterday. Pain initially diffuse, now mostly in the right lower quadrant. Initial encounter.  EXAM: CT ABDOMEN AND PELVIS WITH CONTRAST  TECHNIQUE: Multidetector CT imaging of the abdomen and pelvis was performed using the standard protocol following bolus administration of intravenous contrast.  CONTRAST:  80 ml Omnipaque 300.  COMPARISON:  Abdominal ultrasound same date.  FINDINGS: Lower chest: Mild bibasilar atelectasis. No significant pleural or pericardial effusion.  Hepatobiliary: No focal hepatic abnormalities are identified. There is intra and extrahepatic biliary dilatation as well as possible mild periportal edema. The common bile duct measures up to 8 mm in diameter. No calcified stones are seen within it, although there are questionable small stones near the ampulla, best seen on sagittal image 72. In correlation with prior ultrasound, there is mild gallbladder sludge. No gallbladder wall thickening or surrounding inflammatory  change identified.  Pancreas: Unremarkable. No pancreatic ductal dilatation or surrounding inflammatory changes.  Spleen: Normal in size without focal abnormality.  Adrenals/Urinary Tract: Both adrenal glands appear normal.Both kidneys appear normal without evidence of mass, hydronephrosis or urinary tract calculus. The bladder is nearly empty without apparent abnormality.  Stomach/Bowel: Previous gastric bypass. No evidence of bowel wall thickening or significant distention. The appendix is not visualized (status post appendectomy).No ascites or focal extraluminal fluid collection.  Vascular/Lymphatic: There are no enlarged abdominal or pelvic lymph nodes. Mild aortoiliac atherosclerosis. The left renal vein is retro aortic.  Reproductive: Previous hysterectomy.  No evidence of adnexal mass.  Other: There is a small umbilical hernia containing only fat. Inferior to this, there is a larger hernia containing fat. This has a neck measuring 14 mm on image 46. The herniated fat measures up to 6.9 cm cephalocaudad on coronal image number 22.  Musculoskeletal: No acute or significant osseous findings.  IMPRESSION: 1. Intra and extrahepatic biliary dilatation with questionable small stones in the distal common bile duct. Consider MRCP for further evaluation. 2. No other acute findings demonstrated status post appendectomy, hysterectomy and gastric bypass. 3. Periumbilical hernia containing only fat.   Electronically Signed   By: Richardean Sale M.D.   On: 01/01/2015 07:53    Other results: EKG: pending   Assessment & Plan by Problem: #RUQ, epigastric, RLQ abdominal pain with transaminitis  -US GB distended with sludge. No gallstones or thickening. Negative Murphys sign. CBD diameter 5.3 proximally flaring 7.6 mm distally. CT ab/pelvis with intra/extrahepatitic biliary dilatation with questionable small stones in distal CBD.  No evidence of acute pancreatitis/cholangitis with lipase elevation, fever, jaundice.     -Alkaline phosphatase is 213, AST 203, ALT 91. Will trend CMET in am  -She is s/p laparoscopic gastric sleeve bypass by Dr. Volanda Napoleon [phone]. Called his office to make him aware patient admitted.  She is no longer taking Ursodiol  -MRCP ordered -await GI recs [facility] to see; surgery was also notified in the ED-pt may need bile duct exploration or chlolecystectomy with h/o gastric bypass per up to date -Dilaudid 1 q4prn  -prn Zofran 4 q6 prn  -NS 100 cc/hr  #Hyperglycemia with history of DM 2  -cbg 146 will check A1C. History of DM 2  -consider SSI-S  #Obesity -losing wt s/p bypass surgery. She has  lost 110 lbs since 6 months ago was 262 lbs -per Care Everywhere supposed to take mvt, calcium carbonate 600 mg bid, vit D 2000 units bid  #OSA  -cpap qhs  #Anxiety/Depression  -ordered Wellbutrin 75 mg bid, Zoloft 50 mg qhs   #Tobacco abuse -Wellbutrin -smoking cessation  #F/E/N -NS 100 cc/hr  -CMET in am  -NPO  #DVT px  -Lov,scds  Dispo: Disposition is deferred at this time, awaiting improvement of current medical problems. Anticipated discharge in approximately 2-3 day(s).   The patient does have a current PCP Heidi Cote, NP)/[place] [phone]  and does not need an [facility] hospital follow-up appointment after discharge.  The patient does not have transportation limitations that hinder transportation to clinic appointments.  Signed: Cresenciano Genre, MD 01/01/2015, 9:23 AM

## 2015-01-01 NOTE — ED Notes (Signed)
Pt back from ultrasound.

## 2015-01-01 NOTE — ED Notes (Signed)
Patient wanted to know what she is waiting for.  She wanted more IV fluid.  She says she hasn't eaten since 7 pm last night.  I reassured her that I fast on a regular basis, and she is okay.  Plus she had already been given a bolus of fluid.  Also, let the patient know that the doctor is still collecting information to make a decision.  Patient insisted on more ice.  I gave her 2 chips.

## 2015-01-01 NOTE — Care Management Note (Signed)
Case Management Note  Patient Details  Name: LANEE CHAIN MRN: [id number] Date of Birth: [birth year]  Subjective/Objective:                    Action/Plan:   Expected Discharge Date:       01-04-15            Expected Discharge Plan:  Home/Self Care  In-House Referral:     Discharge planning Services     Post Acute Care Choice:    Choice offered to:     DME Arranged:    DME Agency:     HH Arranged:    HH Agency:     Status of Service:  In process, will continue to follow  Medicare Important Message Given:    Date Medicare IM Given:    Medicare IM give by:    Date Additional Medicare IM Given:    Additional Medicare Important Message give by:     If discussed at [place] of Stay Meetings, dates discussed:    Additional Comments:  Marilu Favre, RN 01/01/2015, 3:42 PM

## 2015-01-01 NOTE — Consult Note (Signed)
[facility] Consult: 11:36 AM 01/01/2015  LOS: 0 days    Referring Provider: ED Dr   Primary Care Physician:  Huel Cote, NP Primary Gastroenterologist:  Dr. Delfin Edis, not seen since 2008.     Reason for Consultation:  Choledocholithiasis?    HPI: Heidi Pugh is a 59 y.o. female.  PMH obesity.  S/p gastric sleeve bariatric surgery at [facility] 05/2014.  Hx GU.  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 OSA on CPCP.  Hx DM, not on meds currently 2008 Colonoscopy: screening study solitary shallow, benign appearing, ulcer of the ileocecal valve.  Pathology:  chronic as well as acute inflammation. ? Ischemic due to compromised circulation following extensive surgery, along with smoking/nicotine related compromised blood flow.   Since the gastric sleeve procedure, the patient has lost 111 pounds. She eats small amounts of healthy foods several times a day. She has limited gastric capacity but does not have nausea or vomiting. She was having reflux symptoms of burning in the neck region occurring at night. This is resolved since she stopped eating close to bedtime.  Patient was awakened suddenly at midnight with intense burning in the upper chest, neck region. It calmed down a little bit with Tums.  However then she developed bilateral pain in her mid to lower abdomen.  This was also intense. She had dry heaves. The lower abdominal pain lasted a total of about 3 hours and she has had it recur intermittently since arrival and admission to the hospital early this morning.  Alk phos is 213, AST/ALT 203/41. WBCs normal. Lipase 24.  CT scan: dilated intra/extra hepatic ducts. 8 mm CBD.  GB sludge. ? Small ampullary stones. ?periportal edema.  abd ultrasound: CBD 5.3 mm proximally,  7.6 mm distally (likely physiologic). GB sludge.   Although she is tender diffusely on exam, the pain is primarily in her lower quadrants. She does not describe typical right upper quadrant pain or radiation into the scapular region which normally accompanies biliary pathology. Her urine is quite dark, about the color of TIA. Up until events last night, the patient had been doing well.  No ETOH, 1/2 PPD smoker.  Increased stress due to having to downsize and move from her home and death of dtr in law late 2014/01/05.     Past Medical History  Diagnosis Date  . Pneumonia 10/2006  . 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 Path: benign acute on chronic inflammation.    . Diabetes mellitus   . High cholesterol   . Cataracts, bilateral   . Sleep apnea     cpap every night  . Depression with anxiety   . Arthritis     knees bilat    Past Surgical History  Procedure Laterality Date  . Tubal ligation    . Hysteroscopy      D & C  . Oophorectomy      BSO  . Cataract extraction    . Abdominal hysterectomy  2005    LAVH-BSO,, MENORRHAGIA AND PELVIC PAIN  . Appendectomy  ~  1940  . Tonsillectomy    . Bariatric surgery  05/2014    gastric sleeve. at [facility]  . Colonoscopy  2008    Prior to Admission medications   Medication Sig Start Date End Date Taking? Authorizing Provider  buPROPion [facility]) 75 MG tablet Take 2 pills a day 06/07/14  Yes Huel Cote, NP  Multiple Vitamin (MULTIVITAMIN WITH MINERALS) TABS tablet Take 1 tablet by mouth daily.   Yes Historical Provider, MD  sertraline (ZOLOFT) 50 MG tablet Take 1 tablet (50 mg total) by mouth daily. 06/07/14  Yes Huel Cote, NP    Scheduled Meds: . buPROPion  75 mg Oral BID  . enoxaparin (LOVENOX) injection  40 mg Subcutaneous Q24H  . sertraline  50 mg Oral QHS   Infusions: . sodium chloride     PRN Meds: HYDROmorphone (DILAUDID) injection, iohexol, ondansetron (ZOFRAN) IV   Allergies as of 01/01/2015 - Review  Complete 01/01/2015  Allergen Reaction Noted  . Aspirin Other (See Comments) 01/08/2011  . Darvocet [propoxyphene n-acetaminophen] Other (See Comments) 01/08/2011  . Etodolac Other (See Comments) 01/08/2011  . Flagyl [metronidazole hcl] Other (See Comments) 01/08/2011  . Hydrocodone-acetaminophen Other (See Comments) 01/08/2011  . Nsaids Other (See Comments) 01/08/2011  . Terazol [terconazole] Other (See Comments) 01/08/2011  . Ultram [tramadol hcl] Other (See Comments) 01/08/2011  . Valium Other (See Comments) 01/08/2011  . Codeine Rash 01/08/2011  . Penicillins Rash 08/05/2011    Family History  Problem Relation Age of Onset  . Colon cancer Father   . Diabetes Sister   . Diabetes Sister   . Prostate cancer Father   . Cholelithiasis      mom, dad, sisters, daughter    History   Social History  . Marital Status: Married    Spouse Name: N/A  . Number of Children: N/A  . Years of Education: N/A   Occupational History  . Not on file.   Social History Main Topics  . Smoking status: Former Smoker    Quit date: 03/26/2014  . Smokeless tobacco: Never Used  . Alcohol Use: Yes     Comment: rare; one glass per year  . Drug Use: No  . Sexual Activity: Not Currently   Other Topics Concern  . Not on file   Social History Narrative    REVIEW OF SYSTEMS: Constitutional:  100 # weight loss since 05/2014.  Along with weight loss, she has been able to stop taking all diabetes medications and her cholesterol medications. ENT:  No nose bleeds Pulm:  Since last night, when the pain is intense, it has made it difficult to breathe. CV:  No palpitations, no LE edema. No chest pain. GU:  No hematuria, no frequency GI:  Per HPI Heme:  No issues with unusual bleeding or prominent bruising.   Transfusions:  None Neuro:  No headaches, no peripheral tingling or numbness Derm:  No itching, no rash or sores.  Endocrine:  No sweats or chills.  No polyuria or dysuria Immunization:  Did  not inquire. Travel:  None beyond local counties in last few months.    PHYSICAL EXAM: Vital signs in last 24 hours: Filed Vitals:   01/01/15 1000  BP: 106/86  Pulse: 80  Temp:   Resp: 16   Wt Readings from Last 3 Encounters:  01/01/15 151 lb (68.493 kg)  05/04/14 250 lb (113.399 kg)  04/20/14 250 lb 12.8 oz (113.762 kg)    General: Pleasant, animated, well appearing WF. Currently comfortable.  Head:  No swelling, no signs of head trauma.  Eyes:  No scleral icterus, no conjunctival pallor. Ears:  Hearing intact with no gross deficit.  Nose:  No congestion or discharge Mouth:  Good dentition. Moist and clear oral mucosa. Neck:  No JVD, no masses, no tenderness. Lungs:  Clear bilaterally. No labored breathing or cough. Heart: RRR. No MRG. S1/S2 audible. Abdomen:  Soft, hypoactive bowel sounds. Not distended. Diffusely tender but no guarding or rebound. No masses. No HSM. No hernia..   Rectal: Deferred.   Musc/Skeltl: No joint erythema, deformity or swelling. Extremities:  No CCE.  Feet are warm with good perfusion. Neurologic:  Oriented 3. No tremor. No extremity weakness. Fully alert. Skin:  No telangiectasia, no rash, no sores. Tattoos:  None observed. Nodes:  No cervical or inguinal adenopathy.   Psych:  Pleasant, animated, in good spirits.  Intake/Output from previous day:   Intake/Output this shift: Total I/O In: 1000 [I.V.:1000] Out: -   LAB RESULTS:  Recent Labs  01/01/15 0132  WBC 8.7  HGB 14.6  HCT 43.9  PLT 182   BMET Lab Results  Component Value Date   NA 143 01/01/2015   NA 138 09/19/2013   K 4.2 01/01/2015   K 3.9 09/19/2013   CL 105 01/01/2015   CL 104 09/19/2013   CO2 29 01/01/2015   CO2 29 09/19/2013   GLUCOSE 146* 01/01/2015   GLUCOSE 134* 09/19/2013   BUN 16 01/01/2015   BUN 11 09/19/2013   CREATININE 0.68 01/01/2015   CREATININE 0.81 09/19/2013   CALCIUM 9.6 01/01/2015   CALCIUM 9.1 09/19/2013   LFT  Recent Labs   01/01/15 0132  PROT 6.3*  ALBUMIN 3.8  AST 203*  ALT 91*  ALKPHOS 213*  BILITOT 0.6   PT/INR No results found for: INR, PROTIME Lipase     Component Value Date/Time   LIPASE 24 01/01/2015 0132    Drugs of Abuse  No results found for: LABOPIA, COCAINSCRNUR, LABBENZ, AMPHETMU, THCU, LABBARB   RADIOLOGY STUDIES: US Abdomen Complete  01/01/2015   CLINICAL DATA:  Epigastric pain since this evening.  EXAM: ULTRASOUND ABDOMEN COMPLETE  COMPARISON:  None.  FINDINGS: Gallbladder: Physiologically distended with sludge. No gallstones or wall thickening visualized. No sonographic Murphy sign noted.  Common bile duct: Diameter: 5.3 mm proximally, flaring distally measuring 7.6 mm.  Liver: No focal lesion identified. Within normal limits in parenchymal echogenicity. There is no intrahepatic biliary ductal dilatation.  IVC: No abnormality visualized.  Pancreas: Visualized portion unremarkable. Pancreatic duct is visible however normal in caliber measuring loss in 2 mm.  Spleen: Size and appearance within normal limits.  Right Kidney: Length: 12.0 cm. Echogenicity within normal limits. No mass or hydronephrosis visualized.  Left Kidney: Length: 10.3 cm. Echogenicity within normal limits. No mass or hydronephrosis visualized.  Abdominal aorta: No aneurysm visualized.  Other findings: None.  IMPRESSION: 1. Physiologically distended gallbladder containing sludge. No stones or findings of acute cholecystitis. 2. Flaring of the distal common bile duct, more proximal duct is normal in caliber. This is likely physiologic in this patient. Correlation with LFTs recommended.   Electronically Signed   By: Jeb Levering M.D.   On: 01/01/2015 04:09   Ct Abdomen Pelvis W Contrast  01/01/2015   CLINICAL DATA:  Severe abdominal pain since yesterday. Pain initially diffuse, now mostly in the right lower quadrant. Initial encounter.  EXAM: CT ABDOMEN AND PELVIS WITH CONTRAST  TECHNIQUE: Multidetector CT imaging of the  abdomen and  pelvis was performed using the standard protocol following bolus administration of intravenous contrast.  CONTRAST:  80 ml Omnipaque 300.  COMPARISON:  Abdominal ultrasound same date.  FINDINGS: Lower chest: Mild bibasilar atelectasis. No significant pleural or pericardial effusion.  Hepatobiliary: No focal hepatic abnormalities are identified. There is intra and extrahepatic biliary dilatation as well as possible mild periportal edema. The common bile duct measures up to 8 mm in diameter. No calcified stones are seen within it, although there are questionable small stones near the ampulla, best seen on sagittal image 72. In correlation with prior ultrasound, there is mild gallbladder sludge. No gallbladder wall thickening or surrounding inflammatory change identified.  Pancreas: Unremarkable. No pancreatic ductal dilatation or surrounding inflammatory changes.  Spleen: Normal in size without focal abnormality.  Adrenals/Urinary Tract: Both adrenal glands appear normal.Both kidneys appear normal without evidence of mass, hydronephrosis or urinary tract calculus. The bladder is nearly empty without apparent abnormality.  Stomach/Bowel: Previous gastric bypass. No evidence of bowel wall thickening or significant distention. The appendix is not visualized (status post appendectomy).No ascites or focal extraluminal fluid collection.  Vascular/Lymphatic: There are no enlarged abdominal or pelvic lymph nodes. Mild aortoiliac atherosclerosis. The left renal vein is retro aortic.  Reproductive: Previous hysterectomy.  No evidence of adnexal mass.  Other: There is a small umbilical hernia containing only fat. Inferior to this, there is a larger hernia containing fat. This has a neck measuring 14 mm on image 46. The herniated fat measures up to 6.9 cm cephalocaudad on coronal image number 22.  Musculoskeletal: No acute or significant osseous findings.  IMPRESSION: 1. Intra and extrahepatic biliary dilatation with  questionable small stones in the distal common bile duct. Consider MRCP for further evaluation. 2. No other acute findings demonstrated status post appendectomy, hysterectomy and gastric bypass. 3. Periumbilical hernia containing only fat.   Electronically Signed   By: Richardean Sale M.D.   On: 01/01/2015 07:53    ENDOSCOPIC STUDIES: Per HPI.  2008 Colonoscopy  IMPRESSION:   *  Upper neck region pain, then lower abdominal pain with dry heaves, elevated LFTs and dilated biliary tree, GB sludge, ? Ampullary stones.    * 05/2014 gastric sleeve bariatric surgery.   *  OSA, on CPAP.    PLAN:     *  Ordered MRCP *  LFTs in AM.  *  Ok to have clears after MRCP   Azucena Freed  01/01/2015, 11:36 AM Pager: [phone] Attending MD note:   I have taken a history, examined the patient, and reviewed the chart. I agree with the Advanced Practitioner's impression and recommendations. Severe abdominal pain, not completely gone, still taking pain meds, Clinical picture consistent with biliary pain in the setting of massive weight loss, abnormal LFT's and strong  Family history of gall bladder disease in both parents, 2 sisters and  her daughter. She is very tender in RUQ.Marland Kitchen MRCP pending to r/o CBD stone. It may be technically difficult to do an ERCP in the setting of bariatric surgery (although her duodenal anatomy is preserved). Will await MRCP results and  follow  LFT's daily.  [facility] Pager # [phone]

## 2015-01-01 NOTE — Progress Notes (Signed)
Faxed  Medical release requisition form to [facility] to Dr. Volanda Napoleon. Waiting response

## 2015-01-01 NOTE — ED Notes (Signed)
Pt stated "my pain is bouncing up and down, one minute it's a 2 and the next minute its a 10"

## 2015-01-02 ENCOUNTER — Encounter (HOSPITAL_COMMUNITY): Payer: Self-pay | Admitting: General Surgery

## 2015-01-02 DIAGNOSIS — E119 Type 2 diabetes mellitus without complications: Secondary | ICD-10-CM

## 2015-01-02 DIAGNOSIS — R51 Headache: Secondary | ICD-10-CM

## 2015-01-02 DIAGNOSIS — K831 Obstruction of bile duct: Secondary | ICD-10-CM

## 2015-01-02 DIAGNOSIS — F17200 Nicotine dependence, unspecified, uncomplicated: Secondary | ICD-10-CM

## 2015-01-02 DIAGNOSIS — F418 Other specified anxiety disorders: Secondary | ICD-10-CM

## 2015-01-02 DIAGNOSIS — Z9884 Bariatric surgery status: Secondary | ICD-10-CM

## 2015-01-02 LAB — COMPREHENSIVE METABOLIC PANEL
ALT: 612 U/L — ABNORMAL HIGH (ref 14–54)
ANION GAP: 9 (ref 5–15)
AST: 434 U/L — ABNORMAL HIGH (ref 15–41)
Albumin: 3 g/dL — ABNORMAL LOW (ref 3.5–5.0)
Alkaline Phosphatase: 265 U/L — ABNORMAL HIGH (ref 38–126)
BILIRUBIN TOTAL: 1.2 mg/dL (ref 0.3–1.2)
BUN: 8 mg/dL (ref 6–20)
CO2: 26 mmol/L (ref 22–32)
CREATININE: 0.59 mg/dL (ref 0.44–1.00)
Calcium: 8.6 mg/dL — ABNORMAL LOW (ref 8.9–10.3)
Chloride: 102 mmol/L (ref 101–111)
GFR calc Af Amer: >60 mL/min (ref >60)
GFR calc non Af Amer: >60 mL/min (ref >60)
GLUCOSE: 95 mg/dL (ref 65–99)
POTASSIUM: 4.3 mmol/L (ref 3.5–5.1)
Sodium: 137 mmol/L (ref 135–145)
Total Protein: 5 g/dL — ABNORMAL LOW (ref 6.5–8.1)

## 2015-01-02 LAB — HEMOGLOBIN A1C
Hgb A1c MFr Bld: 5.7 % — ABNORMAL HIGH (ref 4.8–5.6)
Mean Plasma Glucose: 117 mg/dL

## 2015-01-02 MED ORDER — ENOXAPARIN SODIUM 40 MG/0.4ML ~~LOC~~ SOLN: 40.0000 mg | SUBCUTANEOUS | Status: DC

## 2015-01-02 MED ORDER — CIPROFLOXACIN HCL 500 MG PO TABS
500.0000 mg | ORAL_TABLET | Freq: Two times a day (BID) | ORAL | Status: DC
  Administered 2015-01-02 – 2015-01-03 (×2): 500 mg via ORAL
  Filled 2015-01-02 (×2): qty 1

## 2015-01-02 MED ORDER — PANTOPRAZOLE SODIUM 40 MG IV SOLR
40.0000 mg | INTRAVENOUS | Status: DC
  Administered 2015-01-02: 40 mg via INTRAVENOUS
  Filled 2015-01-02: qty 40

## 2015-01-02 MED ORDER — CALCIUM CARBONATE ANTACID 500 MG PO CHEW: 1.0000 | CHEWABLE_TABLET | Freq: Two times a day (BID) | ORAL | Status: DC | PRN

## 2015-01-02 MED ORDER — ENOXAPARIN SODIUM 40 MG/0.4ML ~~LOC~~ SOLN
40.0000 mg | SUBCUTANEOUS | Status: AC
  Administered 2015-01-02: 40 mg via SUBCUTANEOUS
  Filled 2015-01-02: qty 0.4

## 2015-01-02 NOTE — Progress Notes (Signed)
Discussed with Dr. Jerl Santos, GI medicine attending at [facility] along with Dr. Olevia Perches. It will take some correlation between GI and surgery at [facility] to perform the ERCP, which needs to be done in the operating room. As a result, she will not be able to have the procedure until next week at the earliest. Dr. Amalia Hailey was given the contact information for the patient, and he will contact the patient to schedule an outpatient appointment next week. Dr. Olevia Perches recommends discharging the patient home tomorrow if she is feeling better to follow-up at [facility]. She also recommends starting prophylactic ciprofloxacin given the high risk of cholangitis. -We will monitor patient overnight. -Start ciprofloxacin 500 mg twice a day. -Check lipase tomorrow morning given high risk of pancreatitis. -Possible discharge tomorrow with pain medications, anti-biotics, and strict return precautions to [facility] if worsening.

## 2015-01-02 NOTE — Progress Notes (Signed)
Note pt is s/p gastric bypass along with sleeve gastrectomy.  This means ERCP will need to be done at tertiary facility, such as [facility].  Case d/w Dr Olevia Perches and with intern Dr Trudee Kuster who will contact GI service at [facility].  [phone] Addendum : I have spoken to Dr Trudee Kuster and Dr Jerl Santos at [facility]. Dr Amalia Hailey office will contact pt with appointment for laparoscopically assisted ERCP and stone removal, which is done in coordinated with general surgeon, OR schedule and ERCP schedule ( Dr Amalia Hailey will be doing the ERCP part). In case pt has a recurrence of severe pain, she go go to [facility] ED and to be  Admitted there  and undergo the procedures there. Would discharge on Cipro 250 mg po bid to prevent cholangitis. Pain control, PPI for GERD

## 2015-01-02 NOTE — Progress Notes (Signed)
Subjective  Continued abdominal pain, comes and goes, worse following  Liquids, even water.   Objective  Biliary colic, suspect retained CBD stone, MRCP confirms CBD opbstruction, probably stones but cannot r/o small ampullary lesion. LFTs are going up. Abdomen still tender in RUQ,Have discussed with Dr Eppie Gibson and the team. Will arrange for an ERCP/sphincterotomy,stone extraction. Vital signs in last 24 hours: Temp:  [98 F (36.7 C)-98.8 F (37.1 C)] 98.8 F (37.1 C) (06/01 1448) Pulse Rate:  [70-82] 72 (06/01 0632) Resp:  [15-21] 18 (06/01 1856) BP: (93-108)/(43-86) 93/43 mmHg (06/01 0632) SpO2:  [96 %-98 %] 96 % (06/01 3149) Weight:  [170 lb (77.111 kg)] 170 lb (77.111 kg) (06/01 7026) Last BM Date: 12/31/14 General:    white female in NAD Heart:  Regular rate and rhythm; no murmurs Lungs: Respirations even and unlabored, lungs CTA bilaterally Abdomen:  Soft, very tender RUQ and nondistended. Normal bowel sounds. Extremities:  Without edema. Neurologic:  Alert and oriented,  grossly normal neurologically. Psych:  Cooperative. Normal mood and affect.  Intake/Output from previous day: 05/31 0701 - 06/01 0700 In: 1900 [P.O.:60; I.V.:1840] Out: 504 [Urine:504] Intake/Output this shift:    Lab Results:  Recent Labs  01/01/15 0132  WBC 8.7  HGB 14.6  HCT 43.9  PLT 182   BMET  Recent Labs  01/01/15 0132 01/02/15 0538  NA 143 137  K 4.2 4.3  CL 105 102  CO2 29 26  GLUCOSE 146* 95  BUN 16 8  CREATININE 0.68 0.59  CALCIUM 9.6 8.6*   LFT  Recent Labs  01/02/15 0538  PROT 5.0*  ALBUMIN 3.0*  AST 434*  ALT 612*  ALKPHOS 265*  BILITOT 1.2   PT/INR No results for input(s): LABPROT, INR in the last 72 hours.  Studies/Results: US Abdomen Complete  01/01/2015   CLINICAL DATA:  Epigastric pain since this evening.  EXAM: ULTRASOUND ABDOMEN COMPLETE  COMPARISON:  None.  FINDINGS: Gallbladder: Physiologically distended with sludge. No gallstones or wall  thickening visualized. No sonographic Murphy sign noted.  Common bile duct: Diameter: 5.3 mm proximally, flaring distally measuring 7.6 mm.  Liver: No focal lesion identified. Within normal limits in parenchymal echogenicity. There is no intrahepatic biliary ductal dilatation.  IVC: No abnormality visualized.  Pancreas: Visualized portion unremarkable. Pancreatic duct is visible however normal in caliber measuring loss in 2 mm.  Spleen: Size and appearance within normal limits.  Right Kidney: Length: 12.0 cm. Echogenicity within normal limits. No mass or hydronephrosis visualized.  Left Kidney: Length: 10.3 cm. Echogenicity within normal limits. No mass or hydronephrosis visualized.  Abdominal aorta: No aneurysm visualized.  Other findings: None.  IMPRESSION: 1. Physiologically distended gallbladder containing sludge. No stones or findings of acute cholecystitis. 2. Flaring of the distal common bile duct, more proximal duct is normal in caliber. This is likely physiologic in this patient. Correlation with LFTs recommended.   Electronically Signed   By: Jeb Levering M.D.   On: 01/01/2015 04:09   Ct Abdomen Pelvis W Contrast  01/01/2015   CLINICAL DATA:  Severe abdominal pain since yesterday. Pain initially diffuse, now mostly in the right lower quadrant. Initial encounter.  EXAM: CT ABDOMEN AND PELVIS WITH CONTRAST  TECHNIQUE: Multidetector CT imaging of the abdomen and pelvis was performed using the standard protocol following bolus administration of intravenous contrast.  CONTRAST:  80 ml Omnipaque 300.  COMPARISON:  Abdominal ultrasound same date.  FINDINGS: Lower chest: Mild bibasilar atelectasis. No significant pleural or pericardial effusion.  Hepatobiliary: No focal hepatic abnormalities are identified. There is intra and extrahepatic biliary dilatation as well as possible mild periportal edema. The common bile duct measures up to 8 mm in diameter. No calcified stones are seen within it, although there  are questionable small stones near the ampulla, best seen on sagittal image 72. In correlation with prior ultrasound, there is mild gallbladder sludge. No gallbladder wall thickening or surrounding inflammatory change identified.  Pancreas: Unremarkable. No pancreatic ductal dilatation or surrounding inflammatory changes.  Spleen: Normal in size without focal abnormality.  Adrenals/Urinary Tract: Both adrenal glands appear normal.Both kidneys appear normal without evidence of mass, hydronephrosis or urinary tract calculus. The bladder is nearly empty without apparent abnormality.  Stomach/Bowel: Previous gastric bypass. No evidence of bowel wall thickening or significant distention. The appendix is not visualized (status post appendectomy).No ascites or focal extraluminal fluid collection.  Vascular/Lymphatic: There are no enlarged abdominal or pelvic lymph nodes. Mild aortoiliac atherosclerosis. The left renal vein is retro aortic.  Reproductive: Previous hysterectomy.  No evidence of adnexal mass.  Other: There is a small umbilical hernia containing only fat. Inferior to this, there is a larger hernia containing fat. This has a neck measuring 14 mm on image 46. The herniated fat measures up to 6.9 cm cephalocaudad on coronal image number 22.  Musculoskeletal: No acute or significant osseous findings.  IMPRESSION: 1. Intra and extrahepatic biliary dilatation with questionable small stones in the distal common bile duct. Consider MRCP for further evaluation. 2. No other acute findings demonstrated status post appendectomy, hysterectomy and gastric bypass. 3. Periumbilical hernia containing only fat.   Electronically Signed   By: Richardean Sale M.D.   On: 01/01/2015 07:53   Mr 3d Recon At Scanner  01/02/2015   CLINICAL DATA:  59 year old female with abdominal pain, elevated liver function tests, pruritis and dilated bile ducts.  EXAM: MRI ABDOMEN WITHOUT AND WITH CONTRAST (INCLUDING MRCP)  TECHNIQUE: Multiplanar  multisequence MR imaging of the abdomen was performed both before and after the administration of intravenous contrast. Heavily T2-weighted images of the biliary and pancreatic ducts were obtained, and three-dimensional MRCP images were rendered by post processing.  CONTRAST:  51mL MULTIHANCE GADOBENATE DIMEGLUMINE 529 MG/ML IV SOLN  COMPARISON:  CT of the abdomen and pelvis 01/01/2015.  FINDINGS: Lower chest:  Unremarkable.  Hepatobiliary: No cystic or solid hepatic lesions. MRCP images demonstrate mild intrahepatic biliary ductal dilatation. Common bile duct is also dilated to 10 mm in the porta hepatis. On some of the MRCP images, there is a suggestion of a small filling defect in the mid common bile duct (image 17 of series 14), however, this cannot be confirmed on additional pulse sequences. There is abrupt cut off of the distal common bile duct, with a suggestion of a filling defect, however, this is uncertain at also cannot be confirmed on other pulse sequences. Gallbladder is moderately distended, with no filling defects to suggest gallstones. No gallbladder wall thickening or surrounding pericholecystic fluid to suggest an acute cholecystitis at this time.  Pancreas: MRCP images demonstrate no pancreatic ductal dilatation. No pancreatic mass identified. Specifically, no definite lesion in the head of the pancreas to explain the abrupt cut off of the distal common bile duct. No pancreatic or peripancreatic fluid or inflammatory changes. No peripancreatic lymphadenopathy.  Spleen: Unremarkable.  Adrenals/Urinary Tract: Bilateral adrenal glands and bilateral kidneys are normal in appearance.  Stomach/Bowel: Normal appearance of the stomach and visualized portions of small bowel and colon.  Vascular/Lymphatic: No aneurysm  in the visualized abdominal vasculature. Retroaortic left renal vein (normal anatomical variant) incidentally noted. No lymphadenopathy noted in the abdomen.  Other: No significant volume of  ascites in the visualized peritoneal cavity.  Musculoskeletal: No aggressive osseous lesions noted in the visualized portions of the skeleton.  IMPRESSION: 1. Obstruction of the distal common bile duct. This is of uncertain etiology. Some of the pulse sequences suggests the presence of ductal stones, however, this cannot be confirmed on additional pulse sequences. The possibility of a subtle distal ductal stones, or a very small distal ampullary lesion is not excluded, and correlation with ERCP is recommended at this time. No definite pancreatic head mass is noted, and there is no pancreatic ductal dilatation. Additionally, there are no gallstones in the gallbladder.   Electronically Signed   By: Vinnie Langton M.D.   On: 01/02/2015 08:37   Mr Jeananne Rama W/wo Cm/mrcp  01/02/2015   CLINICAL DATA:  59 year old female with abdominal pain, elevated liver function tests, pruritis and dilated bile ducts.  EXAM: MRI ABDOMEN WITHOUT AND WITH CONTRAST (INCLUDING MRCP)  TECHNIQUE: Multiplanar multisequence MR imaging of the abdomen was performed both before and after the administration of intravenous contrast. Heavily T2-weighted images of the biliary and pancreatic ducts were obtained, and three-dimensional MRCP images were rendered by post processing.  CONTRAST:  49mL MULTIHANCE GADOBENATE DIMEGLUMINE 529 MG/ML IV SOLN  COMPARISON:  CT of the abdomen and pelvis 01/01/2015.  FINDINGS: Lower chest:  Unremarkable.  Hepatobiliary: No cystic or solid hepatic lesions. MRCP images demonstrate mild intrahepatic biliary ductal dilatation. Common bile duct is also dilated to 10 mm in the porta hepatis. On some of the MRCP images, there is a suggestion of a small filling defect in the mid common bile duct (image 17 of series 14), however, this cannot be confirmed on additional pulse sequences. There is abrupt cut off of the distal common bile duct, with a suggestion of a filling defect, however, this is uncertain at also cannot be  confirmed on other pulse sequences. Gallbladder is moderately distended, with no filling defects to suggest gallstones. No gallbladder wall thickening or surrounding pericholecystic fluid to suggest an acute cholecystitis at this time.  Pancreas: MRCP images demonstrate no pancreatic ductal dilatation. No pancreatic mass identified. Specifically, no definite lesion in the head of the pancreas to explain the abrupt cut off of the distal common bile duct. No pancreatic or peripancreatic fluid or inflammatory changes. No peripancreatic lymphadenopathy.  Spleen: Unremarkable.  Adrenals/Urinary Tract: Bilateral adrenal glands and bilateral kidneys are normal in appearance.  Stomach/Bowel: Normal appearance of the stomach and visualized portions of small bowel and colon.  Vascular/Lymphatic: No aneurysm in the visualized abdominal vasculature. Retroaortic left renal vein (normal anatomical variant) incidentally noted. No lymphadenopathy noted in the abdomen.  Other: No significant volume of ascites in the visualized peritoneal cavity.  Musculoskeletal: No aggressive osseous lesions noted in the visualized portions of the skeleton.  IMPRESSION: 1. Obstruction of the distal common bile duct. This is of uncertain etiology. Some of the pulse sequences suggests the presence of ductal stones, however, this cannot be confirmed on additional pulse sequences. The possibility of a subtle distal ductal stones, or a very small distal ampullary lesion is not excluded, and correlation with ERCP is recommended at this time. No definite pancreatic head mass is noted, and there is no pancreatic ductal dilatation. Additionally, there are no gallstones in the gallbladder.   Electronically Signed   By: Vinnie Langton M.D.   On: 01/02/2015  08:37       Assessment / Plan:   Biliary colic, normal bilirubin but MRCP confirms distal CBD obstruction. Likely a benign disease.  Continue antibiotics Continue bowl rest Hold Luvenox dose  tomorrow ERCP/Shincterotom/stone removal tomorrow at 1.00 pm by Dr Carlean Purl discussed with the pt. Will obtain surgical consult in anticipation of lap chole.  Principal Problem:   Abdominal pain Active Problems:   Type 2 diabetes mellitus   Hypercholesteremia   Transaminitis   OSA (obstructive sleep apnea)   Choledocholithiasis   Common bile duct dilatation   Epigastric pain     LOS: 1 day   Delfin Edis  01/02/2015, 9:33 AM

## 2015-01-02 NOTE — Consult Note (Signed)
Heidi Pugh 07-08-1956  [id number].   Requesting MD: Dr. Delfin Edis Chief Complaint/Reason for Consult: choledocholithiasis HPI: This is a 59 yo white female who had morbid obesity and underwent a lap gastric sleeve bypass in October of 2015 by [place] in [place].  She has done well since then.  She used to be a diabetic with HTN.  These have resolved.  She woke up Monday night with severe epigastric pain with burning radiating up into her chest and across the upper portion of her abdomen.  She denies nausea, vomiting, diarrhea, fevers, or chills.  She was brought to the [facility] for further evaluation.  She was noted to have elevated LFTs, but normal TB.  These continue to rise.  She underwent an MRCP which revealed a distal common ductal obstruction, likely secondary to gallstones.  GI is evaluating the patient and plans for ERCP and consulted [place] for post ERCP lap chole.  ROS : Please see HPI, otherwise negative  Family History  Problem Relation Age of Onset  . Colon cancer Father   . Diabetes Sister   . Diabetes Sister   . Prostate cancer Father   . Cholelithiasis      mom, dad, sisters, daughter    Past Medical History  Diagnosis Date  . Pneumonia 10/2006  . 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 Path: benign acute on chronic inflammation.    . Diabetes mellitus   . High cholesterol   . Cataracts, bilateral   . Sleep apnea     cpap every night  . Depression with anxiety   . Arthritis     knees bilat  . Complication of anesthesia     " i [place] "  . Cholelithiases   . GERD (gastroesophageal reflux disease)     Past Surgical History  Procedure Laterality Date  . Tubal ligation    . Hysteroscopy      D & C  . Oophorectomy      BSO  . Cataract extraction    . Abdominal hysterectomy  2005    LAVH-BSO,, MENORRHAGIA AND PELVIC PAIN  . Appendectomy  ~ 1940  . Tonsillectomy    . Colonoscopy  2008  . Laparoscopic sleeve gastric bypass  04-2014, [facility] in  [place] History:  reports that she has been smoking Cigarettes.  She has a 10 pack-year smoking history. She has never used smokeless tobacco. She reports that she drinks alcohol. She reports that she does not use illicit drugs.  Allergies:  Allergies  Allergen Reactions  . Aspirin Other (See Comments)    unknown  . Darvocet [Propoxyphene N-Acetaminophen] Other (See Comments)    unknown  . Etodolac Other (See Comments)    unknown  . Flagyl [Metronidazole Hcl] Other (See Comments)    unknown  . Hydrocodone-Acetaminophen Other (See Comments)    unknown  . Nsaids Other (See Comments)    unknown  . Terazol [Terconazole] Other (See Comments)    unknown  . Ultram [Tramadol Hcl] Other (See Comments)    unknown  . Valium Other (See Comments)    unknown  . Codeine Rash  . Penicillins Rash    Medications Prior to Admission  Medication Sig Dispense Refill  . buPROPion (WELLBUTRIN) 75 MG tablet Take 2 pills a day 180 tablet 3  . Multiple Vitamin (MULTIVITAMIN WITH MINERALS) TABS tablet Take 1 tablet by mouth daily.    . sertraline (ZOLOFT) 50 MG tablet Take 1  tablet (50 mg total) by mouth daily. 90 tablet 3    Blood pressure 93/43, pulse 72, temperature 98.8 F (37.1 C), temperature source Oral, resp. rate 18, height _0  (1.651 m), weight 77.111 kg (170 lb), SpO2 96 %. Physical Exam: General: pleasant, WD, WN white female who is laying in bed in NAD HEENT: head is normocephalic, atraumatic.  Sclera are noninjected.  PERRL.  Ears and nose without any masses or lesions.  Mouth is pink and moist Heart: regular, rate, and rhythm.  Normal s1,s2. No obvious murmurs, gallops, or rubs noted.  Palpable radial and pedal pulses bilaterally Lungs: CTAB, no wheezes, rhonchi, or rales noted.  Respiratory effort nonlabored Abd: soft, tender in RUQ and epigastrium, ND, +BS, no masses, hernias, or organomegaly, multiple scars noted from prior open appy (paramedian incision as child)  and lap incisions. MS: all 4 extremities are symmetrical with no cyanosis, clubbing, or edema. Skin: warm and dry with no masses, lesions, or rashes Psych: A&Ox3 with an appropriate affect.    Results for orders placed or performed during the hospital encounter of 01/01/15 (from the past 48 hour(s))  Urinalysis, Routine w reflex microscopic     Status: Abnormal   Collection Time: 01/01/15  1:27 AM  Result Value Ref Range   Color, Urine YELLOW YELLOW   APPearance CLEAR CLEAR   Specific Gravity, Urine 1.014 1.005 - 1.030   pH 5.0 5.0 - 8.0   Glucose, UA NEGATIVE NEGATIVE mg/dL   Hgb urine dipstick NEGATIVE NEGATIVE   Bilirubin Urine NEGATIVE NEGATIVE   Ketones, ur NEGATIVE NEGATIVE mg/dL   Protein, ur NEGATIVE NEGATIVE mg/dL   Urobilinogen, UA 1.0 0.0 - 1.0 mg/dL   Nitrite NEGATIVE NEGATIVE   Leukocytes, UA TRACE (A) NEGATIVE  Urine microscopic-add on     Status: Abnormal   Collection Time: 01/01/15  1:27 AM  Result Value Ref Range   Squamous Epithelial / LPF MANY (A) RARE   WBC, UA 3-6 <3 WBC/hpf   RBC / HPF 0-2 <3 RBC/hpf   Bacteria, UA FEW (A) RARE   Urine-Other MUCOUS PRESENT   CBC with Differential     Status: None   Collection Time: 01/01/15  1:32 AM  Result Value Ref Range   WBC 8.7 4.0 - 10.5 K/uL   RBC 4.81 3.87 - 5.11 MIL/uL   Hemoglobin 14.6 12.0 - 15.0 g/dL   HCT 43.9 36.0 - 46.0 %   MCV 91.3 78.0 - 100.0 fL   MCH 30.4 26.0 - 34.0 pg   MCHC 33.3 30.0 - 36.0 g/dL   RDW 13.3 11.5 - 15.5 %   Platelets 182 150 - 400 K/uL   Neutrophils Relative % 58 43 - 77 %   Neutro Abs 5.1 1.7 - 7.7 K/uL   Lymphocytes Relative 36 12 - 46 %   Lymphs Abs 3.1 0.7 - 4.0 K/uL   Monocytes Relative 5 3 - 12 %   Monocytes Absolute 0.4 0.1 - 1.0 K/uL   Eosinophils Relative 1 0 - 5 %   Eosinophils Absolute 0.1 0.0 - 0.7 K/uL   Basophils Relative 0 0 - 1 %   Basophils Absolute 0.0 0.0 - 0.1 K/uL  Comprehensive metabolic panel     Status: Abnormal   Collection Time: 01/01/15  1:32 AM   Result Value Ref Range   Sodium 143 135 - 145 mmol/L   Potassium 4.2 3.5 - 5.1 mmol/L   Chloride 105 101 - 111 mmol/L   CO2 29 22 -  32 mmol/L   Glucose, Bld 146 (H) 65 - 99 mg/dL   BUN 16 6 - 20 mg/dL   Creatinine, Ser 0.68 0.44 - 1.00 mg/dL   Calcium 9.6 8.9 - 10.3 mg/dL   Total Protein 6.3 (L) 6.5 - 8.1 g/dL   Albumin 3.8 3.5 - 5.0 g/dL   AST 203 (H) 15 - 41 U/L   ALT 91 (H) 14 - 54 U/L   Alkaline Phosphatase 213 (H) 38 - 126 U/L   Total Bilirubin 0.6 0.3 - 1.2 mg/dL   GFR calc non Af Amer >60 >60 mL/min   GFR calc Af Amer >60 >60 mL/min    Comment: (NOTE) The eGFR has been calculated using the CKD EPI equation. This calculation has not been validated in all clinical situations. eGFR's persistently <60 mL/min signify possible Chronic Kidney Disease.    Anion gap 9 5 - 15  Lipase, blood     Status: None   Collection Time: 01/01/15  1:32 AM  Result Value Ref Range   Lipase 24 22 - 51 U/L  Hemoglobin A1c     Status: Abnormal   Collection Time: 01/01/15 10:40 AM  Result Value Ref Range   Hgb A1c MFr Bld 5.7 (H) 4.8 - 5.6 %    Comment: (NOTE)         Pre-diabetes: 5.7 - 6.4         Diabetes: >6.4         Glycemic control for adults with diabetes: <7.0    Mean Plasma Glucose 117 mg/dL    Comment: (NOTE) Performed At: [facility] [address] [place], [place] [id number] Lindon Romp MD GY:[id number]   Troponin I     Status: None   Collection Time: 01/01/15 10:40 AM  Result Value Ref Range   Troponin I <0.03 <0.031 ng/mL    Comment:        NO INDICATION OF MYOCARDIAL INJURY.   Comprehensive metabolic panel     Status: Abnormal   Collection Time: 01/02/15  5:38 AM  Result Value Ref Range   Sodium 137 135 - 145 mmol/L   Potassium 4.3 3.5 - 5.1 mmol/L   Chloride 102 101 - 111 mmol/L   CO2 26 22 - 32 mmol/L   Glucose, Bld 95 65 - 99 mg/dL   BUN 8 6 - 20 mg/dL   Creatinine, Ser 0.59 0.44 - 1.00 mg/dL   Calcium 8.6 (L) 8.9 - 10.3 mg/dL   Total  Protein 5.0 (L) 6.5 - 8.1 g/dL   Albumin 3.0 (L) 3.5 - 5.0 g/dL   AST 434 (H) 15 - 41 U/L   ALT 612 (H) 14 - 54 U/L   Alkaline Phosphatase 265 (H) 38 - 126 U/L   Total Bilirubin 1.2 0.3 - 1.2 mg/dL   GFR calc non Af Amer >60 >60 mL/min   GFR calc Af Amer >60 >60 mL/min    Comment: (NOTE) The eGFR has been calculated using the CKD EPI equation. This calculation has not been validated in all clinical situations. eGFR's persistently <60 mL/min signify possible Chronic Kidney Disease.    Anion gap 9 5 - 15   US Abdomen Complete  01/01/2015   CLINICAL DATA:  Epigastric pain since this evening.  EXAM: ULTRASOUND ABDOMEN COMPLETE  COMPARISON:  None.  FINDINGS: Gallbladder: Physiologically distended with sludge. No gallstones or wall thickening visualized. No sonographic Murphy sign noted.  Common bile duct: Diameter: 5.3 mm proximally, flaring distally measuring 7.6 mm.  Liver: No focal lesion identified. Within normal limits in parenchymal echogenicity. There is no intrahepatic biliary ductal dilatation.  IVC: No abnormality visualized.  Pancreas: Visualized portion unremarkable. Pancreatic duct is visible however normal in caliber measuring loss in 2 mm.  Spleen: Size and appearance within normal limits.  Right Kidney: Length: 12.0 cm. Echogenicity within normal limits. No mass or hydronephrosis visualized.  Left Kidney: Length: 10.3 cm. Echogenicity within normal limits. No mass or hydronephrosis visualized.  Abdominal aorta: No aneurysm visualized.  Other findings: None.  IMPRESSION: 1. Physiologically distended gallbladder containing sludge. No stones or findings of acute cholecystitis. 2. Flaring of the distal common bile duct, more proximal duct is normal in caliber. This is likely physiologic in this patient. Correlation with LFTs recommended.   Electronically Signed   By: Jeb Levering M.D.   On: 01/01/2015 04:09   Ct Abdomen Pelvis W Contrast  01/01/2015   CLINICAL DATA:  Severe abdominal  pain since yesterday. Pain initially diffuse, now mostly in the right lower quadrant. Initial encounter.  EXAM: CT ABDOMEN AND PELVIS WITH CONTRAST  TECHNIQUE: Multidetector CT imaging of the abdomen and pelvis was performed using the standard protocol following bolus administration of intravenous contrast.  CONTRAST:  80 ml Omnipaque 300.  COMPARISON:  Abdominal ultrasound same date.  FINDINGS: Lower chest: Mild bibasilar atelectasis. No significant pleural or pericardial effusion.  Hepatobiliary: No focal hepatic abnormalities are identified. There is intra and extrahepatic biliary dilatation as well as possible mild periportal edema. The common bile duct measures up to 8 mm in diameter. No calcified stones are seen within it, although there are questionable small stones near the ampulla, best seen on sagittal image 72. In correlation with prior ultrasound, there is mild gallbladder sludge. No gallbladder wall thickening or surrounding inflammatory change identified.  Pancreas: Unremarkable. No pancreatic ductal dilatation or surrounding inflammatory changes.  Spleen: Normal in size without focal abnormality.  Adrenals/Urinary Tract: Both adrenal glands appear normal.Both kidneys appear normal without evidence of mass, hydronephrosis or urinary tract calculus. The bladder is nearly empty without apparent abnormality.  Stomach/Bowel: Previous gastric bypass. No evidence of bowel wall thickening or significant distention. The appendix is not visualized (status post appendectomy).No ascites or focal extraluminal fluid collection.  Vascular/Lymphatic: There are no enlarged abdominal or pelvic lymph nodes. Mild aortoiliac atherosclerosis. The left renal vein is retro aortic.  Reproductive: Previous hysterectomy.  No evidence of adnexal mass.  Other: There is a small umbilical hernia containing only fat. Inferior to this, there is a larger hernia containing fat. This has a neck measuring 14 mm on image 46. The herniated  fat measures up to 6.9 cm cephalocaudad on coronal image number 22.  Musculoskeletal: No acute or significant osseous findings.  IMPRESSION: 1. Intra and extrahepatic biliary dilatation with questionable small stones in the distal common bile duct. Consider MRCP for further evaluation. 2. No other acute findings demonstrated status post appendectomy, hysterectomy and gastric bypass. 3. Periumbilical hernia containing only fat.   Electronically Signed   By: Richardean Sale M.D.   On: 01/01/2015 07:53   Mr 3d Recon At Scanner  01/02/2015   CLINICAL DATA:  59 year old female with abdominal pain, elevated liver function tests, pruritis and dilated bile ducts.  EXAM: MRI ABDOMEN WITHOUT AND WITH CONTRAST (INCLUDING MRCP)  TECHNIQUE: Multiplanar multisequence MR imaging of the abdomen was performed both before and after the administration of intravenous contrast. Heavily T2-weighted images of the biliary and pancreatic ducts were obtained, and three-dimensional MRCP images  were rendered by post processing.  CONTRAST:  29m MULTIHANCE GADOBENATE DIMEGLUMINE 529 MG/ML IV SOLN  COMPARISON:  CT of the abdomen and pelvis 01/01/2015.  FINDINGS: Lower chest:  Unremarkable.  Hepatobiliary: No cystic or solid hepatic lesions. MRCP images demonstrate mild intrahepatic biliary ductal dilatation. Common bile duct is also dilated to 10 mm in the porta hepatis. On some of the MRCP images, there is a suggestion of a small filling defect in the mid common bile duct (image 17 of series 14), however, this cannot be confirmed on additional pulse sequences. There is abrupt cut off of the distal common bile duct, with a suggestion of a filling defect, however, this is uncertain at also cannot be confirmed on other pulse sequences. Gallbladder is moderately distended, with no filling defects to suggest gallstones. No gallbladder wall thickening or surrounding pericholecystic fluid to suggest an acute cholecystitis at this time.  Pancreas:  MRCP images demonstrate no pancreatic ductal dilatation. No pancreatic mass identified. Specifically, no definite lesion in the head of the pancreas to explain the abrupt cut off of the distal common bile duct. No pancreatic or peripancreatic fluid or inflammatory changes. No peripancreatic lymphadenopathy.  Spleen: Unremarkable.  Adrenals/Urinary Tract: Bilateral adrenal glands and bilateral kidneys are normal in appearance.  Stomach/Bowel: Normal appearance of the stomach and visualized portions of small bowel and colon.  Vascular/Lymphatic: No aneurysm in the visualized abdominal vasculature. Retroaortic left renal vein (normal anatomical variant) incidentally noted. No lymphadenopathy noted in the abdomen.  Other: No significant volume of ascites in the visualized peritoneal cavity.  Musculoskeletal: No aggressive osseous lesions noted in the visualized portions of the skeleton.  IMPRESSION: 1. Obstruction of the distal common bile duct. This is of uncertain etiology. Some of the pulse sequences suggests the presence of ductal stones, however, this cannot be confirmed on additional pulse sequences. The possibility of a subtle distal ductal stones, or a very small distal ampullary lesion is not excluded, and correlation with ERCP is recommended at this time. No definite pancreatic head mass is noted, and there is no pancreatic ductal dilatation. Additionally, there are no gallstones in the gallbladder.   Electronically Signed   By: DVinnie LangtonM.D.   On: 01/02/2015 08:37   Mr AJeananne RamaW/wo Cm/mrcp  01/02/2015   CLINICAL DATA:  [age]year old female with abdominal pain, elevated liver function tests, pruritis and dilated bile ducts.  EXAM: MRI ABDOMEN WITHOUT AND WITH CONTRAST (INCLUDING MRCP)  TECHNIQUE: Multiplanar multisequence MR imaging of the abdomen was performed both before and after the administration of intravenous contrast. Heavily T2-weighted images of the biliary and pancreatic ducts were obtained, and  three-dimensional MRCP images were rendered by post processing.  CONTRAST:  196mMULTIHANCE GADOBENATE DIMEGLUMINE 529 MG/ML IV SOLN  COMPARISON:  CT of the abdomen and pelvis 01/01/2015.  FINDINGS: Lower chest:  Unremarkable.  Hepatobiliary: No cystic or solid hepatic lesions. MRCP images demonstrate mild intrahepatic biliary ductal dilatation. Common bile duct is also dilated to 10 mm in the porta hepatis. On some of the MRCP images, there is a suggestion of a small filling defect in the mid common bile duct (image 17 of series 14), however, this cannot be confirmed on additional pulse sequences. There is abrupt cut off of the distal common bile duct, with a suggestion of a filling defect, however, this is uncertain at also cannot be confirmed on other pulse sequences. Gallbladder is moderately distended, with no filling defects to suggest gallstones. No gallbladder wall thickening or surrounding pericholecystic  fluid to suggest an acute cholecystitis at this time.  Pancreas: MRCP images demonstrate no pancreatic ductal dilatation. No pancreatic mass identified. Specifically, no definite lesion in the head of the pancreas to explain the abrupt cut off of the distal common bile duct. No pancreatic or peripancreatic fluid or inflammatory changes. No peripancreatic lymphadenopathy.  Spleen: Unremarkable.  Adrenals/Urinary Tract: Bilateral adrenal glands and bilateral kidneys are normal in appearance.  Stomach/Bowel: Normal appearance of the stomach and visualized portions of small bowel and colon.  Vascular/Lymphatic: No aneurysm in the visualized abdominal vasculature. Retroaortic left renal vein (normal anatomical variant) incidentally noted. No lymphadenopathy noted in the abdomen.  Other: No significant volume of ascites in the visualized peritoneal cavity.  Musculoskeletal: No aggressive osseous lesions noted in the visualized portions of the skeleton.  IMPRESSION: 1. Obstruction of the distal common bile duct.  This is of uncertain etiology. Some of the pulse sequences suggests the presence of ductal stones, however, this cannot be confirmed on additional pulse sequences. The possibility of a subtle distal ductal stones, or a very small distal ampullary lesion is not excluded, and correlation with ERCP is recommended at this time. No definite pancreatic head mass is noted, and there is no pancreatic ductal dilatation. Additionally, there are no gallstones in the gallbladder.   Electronically Signed   By: Vinnie Langton M.D.   On: 01/02/2015 08:37       Assessment/Plan 1. Choledocholithiasis with elevated LFTs -the patient is s/p lap gastric sleeve with BYPASS.  I have contacted Dr. Olevia Perches to let her know that she had a bypass as well.  She said this would change the plans for ERCP since they typically do not do ERCP on bypass patients.  Generally, patient's are transferred to [facility] for the GI team there to do ERCPs on bypass patients.  She could then undergo a lap chole after her ERCP by the general surgery at the new facility.  Zavia Pullen E 01/02/2015, 11:23 AM Pager: [phone]

## 2015-01-02 NOTE — Progress Notes (Signed)
Subjective:    Currently, the patient improvement of her abdominal pain. She reports having a headache this morning that she thinks may be secondary to her Dilaudid. In addition, she reports being concerned about her dark urine.  Interval Events: -Vital signs stable overnight.  -MRCP shows obstruction of distal common bile duct. Possible ductal stones versus very small ampullary lesion.    Objective:    Vital Signs:   Temp:  [98.4 F (36.9 C)-98.8 F (37.1 C)] 98.8 F (37.1 C) (06/01 0867) Pulse Rate:  [70-76] 72 (06/01 0632) Resp:  [15-21] 18 (06/01 6195) BP: (93-108)/(43-54) 93/43 mmHg (06/01 0632) SpO2:  [96 %-98 %] 96 % (06/01 0932) Weight:  [170 lb (77.111 kg)] 170 lb (77.111 kg) (06/01 6712) Last BM Date: 12/31/14  24-hour weight change: Weight change: 19 lb (8.618 kg)  Intake/Output:   Intake/Output Summary (Last 24 hours) at 01/02/15 1408 Last data filed at 01/02/15 0100  Gross per 24 hour  Intake    900 ml  Output    503 ml  Net    397 ml      Physical Exam: General: Well-developed, well-nourished, anxious.  Lungs:  Normal respiratory effort. Clear to auscultation BL without crackles or wheezes.  Heart: RRR. S1 and S2 normal without gallop, murmur, or rubs.  Abdomen:  Soft, Nondistended, right-sided abdominal tenderness. No rebound or guarding.  Extremities: No pretibial edema.     Labs:  Basic Metabolic Panel:  Recent Labs Lab 01/01/15 0132 01/02/15 0538  NA 143 137  K 4.2 4.3  CL 105 102  CO2 29 26  GLUCOSE 146* 95  BUN 16 8  CREATININE 0.68 0.59  CALCIUM 9.6 8.6*    Liver Function Tests:  Recent Labs Lab 01/01/15 0132 01/02/15 0538  AST 203* 434*  ALT 91* 612*  ALKPHOS 213* 265*  BILITOT 0.6 1.2  PROT 6.3* 5.0*  ALBUMIN 3.8 3.0*    Recent Labs Lab 01/01/15 0132  LIPASE 24   CBC:  Recent Labs Lab 01/01/15 0132  WBC 8.7  NEUTROABS 5.1  HGB 14.6  HCT 43.9  MCV 91.3  PLT 182    Cardiac Enzymes:  Recent  Labs Lab 01/01/15 [place] <0.03   Microbiology: Results for orders placed or performed in visit on 12/08/12  WET PREP FOR [place], YEAST, CLUE     Status: None   Collection Time: 12/08/12  8:46 AM  Result Value Ref Range Status   Yeast Wet Prep HPF POC NONE SEEN NONE SEEN Final   Trich, Wet Prep NONE SEEN NONE SEEN Final   Clue Cells Wet Prep HPF POC NONE SEEN NONE SEEN Final   WBC, Wet Prep HPF POC RARE NONE SEEN Final    Comment: MODERATE BACTERIA SEEN (3-6) EPITH. CELLS PER FIELD AMINE NEGATIVE   Imaging: US Abdomen Complete  01/01/2015   CLINICAL DATA:  Epigastric pain since this evening.  EXAM: ULTRASOUND ABDOMEN COMPLETE  COMPARISON:  None.  FINDINGS: Gallbladder: Physiologically distended with sludge. No gallstones or wall thickening visualized. No sonographic Murphy sign noted.  Common bile duct: Diameter: 5.3 mm proximally, flaring distally measuring 7.6 mm.  Liver: No focal lesion identified. Within normal limits in parenchymal echogenicity. There is no intrahepatic biliary ductal dilatation.  IVC: No abnormality visualized.  Pancreas: Visualized portion unremarkable. Pancreatic duct is visible however normal in caliber measuring loss in 2 mm.  Spleen: Size and appearance within normal limits.  Right Kidney: Length: 12.0 cm. Echogenicity within normal limits. No  mass or hydronephrosis visualized.  Left Kidney: Length: 10.3 cm. Echogenicity within normal limits. No mass or hydronephrosis visualized.  Abdominal aorta: No aneurysm visualized.  Other findings: None.  IMPRESSION: 1. Physiologically distended gallbladder containing sludge. No stones or findings of acute cholecystitis. 2. Flaring of the distal common bile duct, more proximal duct is normal in caliber. This is likely physiologic in this patient. Correlation with LFTs recommended.   Electronically Signed   By: Jeb Levering M.D.   On: 01/01/2015 04:09   Ct Abdomen Pelvis W Contrast  01/01/2015   CLINICAL DATA:   Severe abdominal pain since yesterday. Pain initially diffuse, now mostly in the right lower quadrant. Initial encounter.  EXAM: CT ABDOMEN AND PELVIS WITH CONTRAST  TECHNIQUE: Multidetector CT imaging of the abdomen and pelvis was performed using the standard protocol following bolus administration of intravenous contrast.  CONTRAST:  80 ml Omnipaque 300.  COMPARISON:  Abdominal ultrasound same date.  FINDINGS: Lower chest: Mild bibasilar atelectasis. No significant pleural or pericardial effusion.  Hepatobiliary: No focal hepatic abnormalities are identified. There is intra and extrahepatic biliary dilatation as well as possible mild periportal edema. The common bile duct measures up to 8 mm in diameter. No calcified stones are seen within it, although there are questionable small stones near the ampulla, best seen on sagittal image 72. In correlation with prior ultrasound, there is mild gallbladder sludge. No gallbladder wall thickening or surrounding inflammatory change identified.  Pancreas: Unremarkable. No pancreatic ductal dilatation or surrounding inflammatory changes.  Spleen: Normal in size without focal abnormality.  Adrenals/Urinary Tract: Both adrenal glands appear normal.Both kidneys appear normal without evidence of mass, hydronephrosis or urinary tract calculus. The bladder is nearly empty without apparent abnormality.  Stomach/Bowel: Previous gastric bypass. No evidence of bowel wall thickening or significant distention. The appendix is not visualized (status post appendectomy).No ascites or focal extraluminal fluid collection.  Vascular/Lymphatic: There are no enlarged abdominal or pelvic lymph nodes. Mild aortoiliac atherosclerosis. The left renal vein is retro aortic.  Reproductive: Previous hysterectomy.  No evidence of adnexal mass.  Other: There is a small umbilical hernia containing only fat. Inferior to this, there is a larger hernia containing fat. This has a neck measuring 14 mm on image  46. The herniated fat measures up to 6.9 cm cephalocaudad on coronal image number 22.  Musculoskeletal: No acute or significant osseous findings.  IMPRESSION: 1. Intra and extrahepatic biliary dilatation with questionable small stones in the distal common bile duct. Consider MRCP for further evaluation. 2. No other acute findings demonstrated status post appendectomy, hysterectomy and gastric bypass. 3. Periumbilical hernia containing only fat.   Electronically Signed   By: Richardean Sale M.D.   On: 01/01/2015 07:53   Mr 3d Recon At Scanner  01/02/2015   CLINICAL DATA:  59 year old female with abdominal pain, elevated liver function tests, pruritis and dilated bile ducts.  EXAM: MRI ABDOMEN WITHOUT AND WITH CONTRAST (INCLUDING MRCP)  TECHNIQUE: Multiplanar multisequence MR imaging of the abdomen was performed both before and after the administration of intravenous contrast. Heavily T2-weighted images of the biliary and pancreatic ducts were obtained, and three-dimensional MRCP images were rendered by post processing.  CONTRAST:  15mL MULTIHANCE GADOBENATE DIMEGLUMINE 529 MG/ML IV SOLN  COMPARISON:  CT of the abdomen and pelvis 01/01/2015.  FINDINGS: Lower chest:  Unremarkable.  Hepatobiliary: No cystic or solid hepatic lesions. MRCP images demonstrate mild intrahepatic biliary ductal dilatation. Common bile duct is also dilated to 10 mm in the porta  hepatis. On some of the MRCP images, there is a suggestion of a small filling defect in the mid common bile duct (image 17 of series 14), however, this cannot be confirmed on additional pulse sequences. There is abrupt cut off of the distal common bile duct, with a suggestion of a filling defect, however, this is uncertain at also cannot be confirmed on other pulse sequences. Gallbladder is moderately distended, with no filling defects to suggest gallstones. No gallbladder wall thickening or surrounding pericholecystic fluid to suggest an acute cholecystitis at this  time.  Pancreas: MRCP images demonstrate no pancreatic ductal dilatation. No pancreatic mass identified. Specifically, no definite lesion in the head of the pancreas to explain the abrupt cut off of the distal common bile duct. No pancreatic or peripancreatic fluid or inflammatory changes. No peripancreatic lymphadenopathy.  Spleen: Unremarkable.  Adrenals/Urinary Tract: Bilateral adrenal glands and bilateral kidneys are normal in appearance.  Stomach/Bowel: Normal appearance of the stomach and visualized portions of small bowel and colon.  Vascular/Lymphatic: No aneurysm in the visualized abdominal vasculature. Retroaortic left renal vein (normal anatomical variant) incidentally noted. No lymphadenopathy noted in the abdomen.  Other: No significant volume of ascites in the visualized peritoneal cavity.  Musculoskeletal: No aggressive osseous lesions noted in the visualized portions of the skeleton.  IMPRESSION: 1. Obstruction of the distal common bile duct. This is of uncertain etiology. Some of the pulse sequences suggests the presence of ductal stones, however, this cannot be confirmed on additional pulse sequences. The possibility of a subtle distal ductal stones, or a very small distal ampullary lesion is not excluded, and correlation with ERCP is recommended at this time. No definite pancreatic head mass is noted, and there is no pancreatic ductal dilatation. Additionally, there are no gallstones in the gallbladder.   Electronically Signed   By: Vinnie Langton M.D.   On: 01/02/2015 08:37   Mr Jeananne Rama W/wo Cm/mrcp  01/02/2015   CLINICAL DATA:  59 year old female with abdominal pain, elevated liver function tests, pruritis and dilated bile ducts.  EXAM: MRI ABDOMEN WITHOUT AND WITH CONTRAST (INCLUDING MRCP)  TECHNIQUE: Multiplanar multisequence MR imaging of the abdomen was performed both before and after the administration of intravenous contrast. Heavily T2-weighted images of the biliary and pancreatic ducts  were obtained, and three-dimensional MRCP images were rendered by post processing.  CONTRAST:  63mL MULTIHANCE GADOBENATE DIMEGLUMINE 529 MG/ML IV SOLN  COMPARISON:  CT of the abdomen and pelvis 01/01/2015.  FINDINGS: Lower chest:  Unremarkable.  Hepatobiliary: No cystic or solid hepatic lesions. MRCP images demonstrate mild intrahepatic biliary ductal dilatation. Common bile duct is also dilated to 10 mm in the porta hepatis. On some of the MRCP images, there is a suggestion of a small filling defect in the mid common bile duct (image 17 of series 14), however, this cannot be confirmed on additional pulse sequences. There is abrupt cut off of the distal common bile duct, with a suggestion of a filling defect, however, this is uncertain at also cannot be confirmed on other pulse sequences. Gallbladder is moderately distended, with no filling defects to suggest gallstones. No gallbladder wall thickening or surrounding pericholecystic fluid to suggest an acute cholecystitis at this time.  Pancreas: MRCP images demonstrate no pancreatic ductal dilatation. No pancreatic mass identified. Specifically, no definite lesion in the head of the pancreas to explain the abrupt cut off of the distal common bile duct. No pancreatic or peripancreatic fluid or inflammatory changes. No peripancreatic lymphadenopathy.  Spleen: Unremarkable.  Adrenals/Urinary  Tract: Bilateral adrenal glands and bilateral kidneys are normal in appearance.  Stomach/Bowel: Normal appearance of the stomach and visualized portions of small bowel and colon.  Vascular/Lymphatic: No aneurysm in the visualized abdominal vasculature. Retroaortic left renal vein (normal anatomical variant) incidentally noted. No lymphadenopathy noted in the abdomen.  Other: No significant volume of ascites in the visualized peritoneal cavity.  Musculoskeletal: No aggressive osseous lesions noted in the visualized portions of the skeleton.  IMPRESSION: 1. Obstruction of the distal  common bile duct. This is of uncertain etiology. Some of the pulse sequences suggests the presence of ductal stones, however, this cannot be confirmed on additional pulse sequences. The possibility of a subtle distal ductal stones, or a very small distal ampullary lesion is not excluded, and correlation with ERCP is recommended at this time. No definite pancreatic head mass is noted, and there is no pancreatic ductal dilatation. Additionally, there are no gallstones in the gallbladder.   Electronically Signed   By: Vinnie Langton M.D.   On: 01/02/2015 08:37       Medications:    Infusions: . sodium chloride 100 mL/hr at 01/02/15 1037    Scheduled Medications: . buPROPion  75 mg Oral BID  . multivitamin with minerals  1 tablet Oral Daily  . pantoprazole (PROTONIX) IV  40 mg Intravenous Q24H  . sertraline  50 mg Oral QHS    PRN Medications: HYDROmorphone (DILAUDID) injection, ondansetron (ZOFRAN) IV   Assessment/ Plan:    Principal Problem:   Abdominal pain Active Problems:   Type 2 diabetes mellitus   Hypercholesteremia   Transaminitis   OSA (obstructive sleep apnea)   Choledocholithiasis   Common bile duct dilatation   Epigastric pain  #Common bile duct obstruction Dilation of distal bile duct, which is the etiology of her pain.  MRCP suggests stones versus small ampullary lesion. Her liver function tests continued to rise today, and she needs decompression with ERCP. Unfortunately, she is unable to undergo ERCP here due to her previous gastric bypass, and she will need to be transferred to [facility] for the procedure. Seen by surgery who is also recommending ERCP prior to laparoscopic cholecystectomy in the future. -We will contact [facility] for transfer. -Continue Dilaudid 1 mg every 6 hours when necessary. -Continue Zofran 4 mg every 6 hours when necessary. -Continue normal saline at 100 mL per hour. -Continue bariatric full liquid diet.  #Headache She has  allergies to most pain medications limiting her potential therapies. She reports that a warm rag has been helping. -K pad when necessary.  #Type 2 diabetes Not currently on any medications. Blood glucose stable. -Continue to monitor.  #Anxiety/depression -Continue Zoloft 50 mg daily at bedtime.  #Tobacco abuse -Continue Wellbutrin 75 mg twice a day.  #Status post gastric bypass surgery The patient reports needing increased protein intake since her surgery. -Consult nutrition for recommendations.   DVT PPX - SCD's while in bed  CODE STATUS - Full.  CONSULTS PLACED - GI, general surgery.  DISPO - Will attempt to arrange transfer to [facility] today for ERCP.  The patient does have a current PCP Elon Alas J, NP) and does not need an [facility] hospital follow-up appointment after discharge.    Is the [facility] hospital follow-up appointment a one-time only appointment? not applicable.  Does the patient have transportation limitations that hinder transportation to clinic appointments? no   SERVICE NEEDED AT [place]         Y =  Yes, Blank = No PT:   OT:   RN:   Equipment:   Other:      Length of Stay: 1 day(s)   Signed: Charlesetta Shanks, MD  PGY-1, Internal Medicine Resident Pager: [phone] (7AM-5PM) 01/02/2015, 2:08 PM

## 2015-01-02 NOTE — Progress Notes (Signed)
Appreciate Surgical consult. Pt's anatomy will not allow for standard ERCP, due to jejunostomy by pass. I will make arrangement for her to go to [facility] ASAP .

## 2015-01-03 ENCOUNTER — Encounter (HOSPITAL_COMMUNITY)
Admission: EM | Disposition: A | Discharge status: Home / Self Care | Payer: Self-pay | Source: Home / Self Care | Attending: Internal Medicine

## 2015-01-03 DIAGNOSIS — K838 Other specified diseases of biliary tract: Secondary | ICD-10-CM

## 2015-01-03 LAB — CBC
HCT: 37.6 % (ref 36.0–46.0)
Hemoglobin: 12.4 g/dL (ref 12.0–15.0)
MCH: 30.2 pg (ref 26.0–34.0)
MCHC: 33 g/dL (ref 30.0–36.0)
MCV: 91.7 fL (ref 78.0–100.0)
Platelets: 101 10*3/uL — ABNORMAL LOW (ref 150–400)
RBC: 4.1 MIL/uL (ref 3.87–5.11)
RDW: 13.1 % (ref 11.5–15.5)
WBC: 4.5 10*3/uL (ref 4.0–10.5)

## 2015-01-03 LAB — COMPREHENSIVE METABOLIC PANEL
ALK PHOS: 246 U/L — AB (ref 38–126)
ALT: 359 U/L — ABNORMAL HIGH (ref 14–54)
AST: 135 U/L — ABNORMAL HIGH (ref 15–41)
Albumin: 2.8 g/dL — ABNORMAL LOW (ref 3.5–5.0)
Anion gap: 10 (ref 5–15)
BILIRUBIN TOTAL: 0.4 mg/dL (ref 0.3–1.2)
BUN: 8 mg/dL (ref 6–20)
CO2: 24 mmol/L (ref 22–32)
Calcium: 8.5 mg/dL — ABNORMAL LOW (ref 8.9–10.3)
Chloride: 102 mmol/L (ref 101–111)
Creatinine, Ser: 0.61 mg/dL (ref 0.44–1.00)
GFR calc Af Amer: >60 mL/min (ref >60)
Glucose, Bld: 88 mg/dL (ref 65–99)
Potassium: 3.9 mmol/L (ref 3.5–5.1)
Sodium: 136 mmol/L (ref 135–145)
TOTAL PROTEIN: 5.2 g/dL — AB (ref 6.5–8.1)

## 2015-01-03 LAB — LIPASE, BLOOD: Lipase: 13 U/L — ABNORMAL LOW (ref 22–51)

## 2015-01-03 SURGERY — ERCP, WITH INTERVENTION IF INDICATED
Anesthesia: General

## 2015-01-03 MED ORDER — CIPROFLOXACIN HCL 500 MG PO TABS: 500.0000 mg | ORAL_TABLET | Freq: Two times a day (BID) | ORAL | Status: DC

## 2015-01-03 MED ORDER — PANTOPRAZOLE SODIUM 40 MG PO TBEC
40.0000 mg | DELAYED_RELEASE_TABLET | Freq: Every day | ORAL | Status: DC
  Administered 2015-01-03: 40 mg via ORAL
  Filled 2015-01-03: qty 1

## 2015-01-03 MED ORDER — ONDANSETRON HCL 4 MG PO TABS: 4.0000 mg | ORAL_TABLET | Freq: Three times a day (TID) | ORAL | Status: DC | PRN

## 2015-01-03 MED ORDER — PANTOPRAZOLE SODIUM 40 MG PO TBEC: 40.0000 mg | DELAYED_RELEASE_TABLET | Freq: Every day | ORAL | Status: DC

## 2015-01-03 MED ORDER — HYDROMORPHONE HCL 2 MG PO TABS: 2.0000 mg | ORAL_TABLET | ORAL | Status: DC | PRN

## 2015-01-03 NOTE — Discharge Instructions (Signed)
·   Thank you for allowing [place] to be involved in your healthcare while you were hospitalized at [facility].   Please note that there have been changes to your home medications.  --> PLEASE LOOK AT YOUR DISCHARGE MEDICATION LIST FOR DETAILS.   Please call your PCP if you have any questions or concerns, or any difficulty getting any of your medications.  You can also call our clinic at [phone] if you have any questions or concerns.  Dr. Jenny Reichmann Sonia Side) Amalia Hailey from [facility] will contact you to schedule an appointment to set up your ERCP.  For now, we have given you pain medications and anti-biotics that you can continue until then.  Please return to the ER if you have:  Worsening uncontrolled abdominal pain  Nausea or vomiting  Fever or chills

## 2015-01-03 NOTE — Discharge Summary (Signed)
Name: Heidi Pugh MRN: [id number] DOB: 1956-05-22 59 y.o. PCP: Huel Cote, NP  Date of Admission: 01/01/2015  2:07 AM Date of Discharge: 01/03/2015 Attending Physician: Oval Linsey, MD  Discharge Diagnosis: Principal Problem:   Common bile duct dilatation Active Problems:   Type 2 diabetes mellitus   Hypercholesteremia   Abdominal pain   Transaminitis   OSA (obstructive sleep apnea)   Choledocholithiasis   Epigastric pain  Discharge Medications:   Medication List    TAKE these medications        buPROPion 75 MG tablet  Commonly known as:  WELLBUTRIN  Take 2 pills a day     ciprofloxacin 500 MG tablet  Commonly known as:  CIPRO  Take 1 tablet (500 mg total) by mouth 2 (two) times daily.     HYDROmorphone 2 MG tablet  Commonly known as:  DILAUDID  Take 1 tablet (2 mg total) by mouth every 4 (four) hours as needed for severe pain.     multivitamin with minerals Tabs tablet  Take 1 tablet by mouth daily.     ondansetron 4 MG tablet  Commonly known as:  ZOFRAN  Take 1 tablet (4 mg total) by mouth every 8 (eight) hours as needed for nausea or vomiting.     pantoprazole 40 MG tablet  Commonly known as:  PROTONIX  Take 1 tablet (40 mg total) by mouth daily.     sertraline 50 MG tablet  Commonly known as:  ZOLOFT  Take 1 tablet (50 mg total) by mouth daily.        Disposition and follow-up:   Ms.Analie A Straw was discharged from [facility] in [place] condition.  At the hospital follow up visit please address:  1.  Follow-up with Dr. Amalia Hailey at [facility] for ERCP planning, pain control, check for complications of common bile duct obstruction, platelet count.  2.  Labs / imaging needed at time of follow-up: CMP, lipase, platelets.  3.  Pending labs/ test needing follow-up: None.  Follow-up Appointments: Follow-up Information    Follow up with Huel Cote, NP On 01/08/2015.   Specialties:  Obstetrics and Gynecology, Radiology   Why:  9:30  am   Contact information:   [place] [address] [phone]       Follow up with Alvera Novel, MD.   Specialty:  Gastroenterology   Why:  Will call you to schedule an appointment next week to plan ERCP.   Contact information:   [address] [place]  [address] [phone]       Discharge Instructions: Discharge Instructions    Activity as tolerated - No restrictions    Complete by:  As directed      Call MD for:  difficulty breathing, headache or visual disturbances    Complete by:  As directed      Call MD for:  extreme fatigue    Complete by:  As directed      Call MD for:  hives    Complete by:  As directed      Call MD for:  persistant dizziness or light-headedness    Complete by:  As directed      Call MD for:  persistant nausea and vomiting    Complete by:  As directed      Call MD for:  severe uncontrolled pain    Complete by:  As directed      Call MD for:  temperature >100.4    Complete by:  As directed            Thank you for allowing [place] to be involved in your healthcare while you were hospitalized at [facility].   Please note that there have been changes to your home medications.  --> PLEASE LOOK AT YOUR DISCHARGE MEDICATION LIST FOR DETAILS.   Please call your PCP if you have any questions or concerns, or any difficulty getting any of your medications.  You can also call our clinic at [phone] if you have any questions or concerns.  Dr. Jenny Reichmann Sonia Side) Amalia Hailey from [facility] will contact you to schedule an appointment to set up your ERCP.  For now, we have given you pain medications and anti-biotics that you can continue until then.  Please return to the ER if you have:  Worsening uncontrolled abdominal pain  Nausea or vomiting  Fever or chills  Consultations: GI, General Surgery.  Procedures Performed:  US Abdomen Complete  01/01/2015   CLINICAL DATA:  Epigastric  pain since this evening.  EXAM: ULTRASOUND ABDOMEN COMPLETE  COMPARISON:  None.  FINDINGS: Gallbladder: Physiologically distended with sludge. No gallstones or wall thickening visualized. No sonographic Murphy sign noted.  Common bile duct: Diameter: 5.3 mm proximally, flaring distally measuring 7.6 mm.  Liver: No focal lesion identified. Within normal limits in parenchymal echogenicity. There is no intrahepatic biliary ductal dilatation.  IVC: No abnormality visualized.  Pancreas: Visualized portion unremarkable. Pancreatic duct is visible however normal in caliber measuring loss in 2 mm.  Spleen: Size and appearance within normal limits.  Right Kidney: Length: 12.0 cm. Echogenicity within normal limits. No mass or hydronephrosis visualized.  Left Kidney: Length: 10.3 cm. Echogenicity within normal limits. No mass or hydronephrosis visualized.  Abdominal aorta: No aneurysm visualized.  Other findings: None.  IMPRESSION: 1. Physiologically distended gallbladder containing sludge. No stones or findings of acute cholecystitis. 2. Flaring of the distal common bile duct, more proximal duct is normal in caliber. This is likely physiologic in this patient. Correlation with LFTs recommended.   Electronically Signed   By: Jeb Levering M.D.   On: 01/01/2015 04:09   Ct Abdomen Pelvis W Contrast  01/01/2015   CLINICAL DATA:  Severe abdominal pain since yesterday. Pain initially diffuse, now mostly in the right lower quadrant. Initial encounter.  EXAM: CT ABDOMEN AND PELVIS WITH CONTRAST  TECHNIQUE: Multidetector CT imaging of the abdomen and pelvis was performed using the standard protocol following bolus administration of intravenous contrast.  CONTRAST:  80 ml Omnipaque 300.  COMPARISON:  Abdominal ultrasound same date.  FINDINGS: Lower chest: Mild bibasilar atelectasis. No significant pleural or pericardial effusion.  Hepatobiliary: No focal hepatic abnormalities are identified. There is intra and extrahepatic  biliary dilatation as well as possible mild periportal edema. The common bile duct measures up to 8 mm in diameter. No calcified stones are seen within it, although there are questionable small stones near the ampulla, best seen on sagittal image 72. In correlation with prior ultrasound, there is mild gallbladder sludge. No gallbladder wall thickening or surrounding inflammatory change identified.  Pancreas: Unremarkable. No pancreatic ductal dilatation or surrounding inflammatory changes.  Spleen: Normal in size without focal abnormality.  Adrenals/Urinary Tract: Both adrenal glands appear normal.Both kidneys appear normal without evidence of mass, hydronephrosis or urinary tract calculus. The bladder is nearly empty without apparent abnormality.  Stomach/Bowel: Previous gastric bypass. No evidence of bowel wall thickening or significant distention. The appendix is  not visualized (status post appendectomy).No ascites or focal extraluminal fluid collection.  Vascular/Lymphatic: There are no enlarged abdominal or pelvic lymph nodes. Mild aortoiliac atherosclerosis. The left renal vein is retro aortic.  Reproductive: Previous hysterectomy.  No evidence of adnexal mass.  Other: There is a small umbilical hernia containing only fat. Inferior to this, there is a larger hernia containing fat. This has a neck measuring 14 mm on image 46. The herniated fat measures up to 6.9 cm cephalocaudad on coronal image number 22.  Musculoskeletal: No acute or significant osseous findings.  IMPRESSION: 1. Intra and extrahepatic biliary dilatation with questionable small stones in the distal common bile duct. Consider MRCP for further evaluation. 2. No other acute findings demonstrated status post appendectomy, hysterectomy and gastric bypass. 3. Periumbilical hernia containing only fat.   Electronically Signed   By: Richardean Sale M.D.   On: 01/01/2015 07:53   Mr 3d Recon At Scanner  01/02/2015   CLINICAL DATA:  59 year old female  with abdominal pain, elevated liver function tests, pruritis and dilated bile ducts.  EXAM: MRI ABDOMEN WITHOUT AND WITH CONTRAST (INCLUDING MRCP)  TECHNIQUE: Multiplanar multisequence MR imaging of the abdomen was performed both before and after the administration of intravenous contrast. Heavily T2-weighted images of the biliary and pancreatic ducts were obtained, and three-dimensional MRCP images were rendered by post processing.  CONTRAST:  49mL MULTIHANCE GADOBENATE DIMEGLUMINE 529 MG/ML IV SOLN  COMPARISON:  CT of the abdomen and pelvis 01/01/2015.  FINDINGS: Lower chest:  Unremarkable.  Hepatobiliary: No cystic or solid hepatic lesions. MRCP images demonstrate mild intrahepatic biliary ductal dilatation. Common bile duct is also dilated to 10 mm in the porta hepatis. On some of the MRCP images, there is a suggestion of a small filling defect in the mid common bile duct (image 17 of series 14), however, this cannot be confirmed on additional pulse sequences. There is abrupt cut off of the distal common bile duct, with a suggestion of a filling defect, however, this is uncertain at also cannot be confirmed on other pulse sequences. Gallbladder is moderately distended, with no filling defects to suggest gallstones. No gallbladder wall thickening or surrounding pericholecystic fluid to suggest an acute cholecystitis at this time.  Pancreas: MRCP images demonstrate no pancreatic ductal dilatation. No pancreatic mass identified. Specifically, no definite lesion in the head of the pancreas to explain the abrupt cut off of the distal common bile duct. No pancreatic or peripancreatic fluid or inflammatory changes. No peripancreatic lymphadenopathy.  Spleen: Unremarkable.  Adrenals/Urinary Tract: Bilateral adrenal glands and bilateral kidneys are normal in appearance.  Stomach/Bowel: Normal appearance of the stomach and visualized portions of small bowel and colon.  Vascular/Lymphatic: No aneurysm in the visualized  abdominal vasculature. Retroaortic left renal vein (normal anatomical variant) incidentally noted. No lymphadenopathy noted in the abdomen.  Other: No significant volume of ascites in the visualized peritoneal cavity.  Musculoskeletal: No aggressive osseous lesions noted in the visualized portions of the skeleton.  IMPRESSION: 1. Obstruction of the distal common bile duct. This is of uncertain etiology. Some of the pulse sequences suggests the presence of ductal stones, however, this cannot be confirmed on additional pulse sequences. The possibility of a subtle distal ductal stones, or a very small distal ampullary lesion is not excluded, and correlation with ERCP is recommended at this time. No definite pancreatic head mass is noted, and there is no pancreatic ductal dilatation. Additionally, there are no gallstones in the gallbladder.   Electronically Signed   By:  Vinnie Langton M.D.   On: 01/02/2015 08:37   Mr Jeananne Rama W/wo Cm/mrcp  01/02/2015   CLINICAL DATA:  59 year old female with abdominal pain, elevated liver function tests, pruritis and dilated bile ducts.  EXAM: MRI ABDOMEN WITHOUT AND WITH CONTRAST (INCLUDING MRCP)  TECHNIQUE: Multiplanar multisequence MR imaging of the abdomen was performed both before and after the administration of intravenous contrast. Heavily T2-weighted images of the biliary and pancreatic ducts were obtained, and three-dimensional MRCP images were rendered by post processing.  CONTRAST:  75mL MULTIHANCE GADOBENATE DIMEGLUMINE 529 MG/ML IV SOLN  COMPARISON:  CT of the abdomen and pelvis 01/01/2015.  FINDINGS: Lower chest:  Unremarkable.  Hepatobiliary: No cystic or solid hepatic lesions. MRCP images demonstrate mild intrahepatic biliary ductal dilatation. Common bile duct is also dilated to 10 mm in the porta hepatis. On some of the MRCP images, there is a suggestion of a small filling defect in the mid common bile duct (image 17 of series 14), however, this cannot be confirmed on  additional pulse sequences. There is abrupt cut off of the distal common bile duct, with a suggestion of a filling defect, however, this is uncertain at also cannot be confirmed on other pulse sequences. Gallbladder is moderately distended, with no filling defects to suggest gallstones. No gallbladder wall thickening or surrounding pericholecystic fluid to suggest an acute cholecystitis at this time.  Pancreas: MRCP images demonstrate no pancreatic ductal dilatation. No pancreatic mass identified. Specifically, no definite lesion in the head of the pancreas to explain the abrupt cut off of the distal common bile duct. No pancreatic or peripancreatic fluid or inflammatory changes. No peripancreatic lymphadenopathy.  Spleen: Unremarkable.  Adrenals/Urinary Tract: Bilateral adrenal glands and bilateral kidneys are normal in appearance.  Stomach/Bowel: Normal appearance of the stomach and visualized portions of small bowel and colon.  Vascular/Lymphatic: No aneurysm in the visualized abdominal vasculature. Retroaortic left renal vein (normal anatomical variant) incidentally noted. No lymphadenopathy noted in the abdomen.  Other: No significant volume of ascites in the visualized peritoneal cavity.  Musculoskeletal: No aggressive osseous lesions noted in the visualized portions of the skeleton.  IMPRESSION: 1. Obstruction of the distal common bile duct. This is of uncertain etiology. Some of the pulse sequences suggests the presence of ductal stones, however, this cannot be confirmed on additional pulse sequences. The possibility of a subtle distal ductal stones, or a very small distal ampullary lesion is not excluded, and correlation with ERCP is recommended at this time. No definite pancreatic head mass is noted, and there is no pancreatic ductal dilatation. Additionally, there are no gallstones in the gallbladder.   Electronically Signed   By: Vinnie Langton M.D.   On: 01/02/2015 08:37   Admission HPI:  59 y.o.  pna pneumonia, GI ulcer, DM 2, obesity, HLD,b/l cataracts, OSA on cpap, anxiety/depression, arthritis, s/p gastric sleeve bypass 6 months ago (05/2014-[facility] [place] Dr. Volanda Napoleon). She presented for RUQ, Epigastric, RLQ abdominal pain since 01/01/15 at midnight. Abdominal pain is intermittent stabbing >10/10. She states the abdominal pain was generalized but now epigastric, RUQ, RLQ. Pain hurt so bad it took her breath away. Pain has gone down to 3-5/10 with medications given in the ED (Diluadid 1 mg x 3, Zofran 4 mg x 1, NS 500 cc bolus). She has not tried any medications and pain is not worsened by anything. She tried different positions at home w/o relief of pain. Abdominal pain is associated with nausea and dry heaves but not fever.  Hospital Course  by problem list: Principal Problem:   Common bile duct dilatation Active Problems:   Type 2 diabetes mellitus   Hypercholesteremia   Abdominal pain   Transaminitis   OSA (obstructive sleep apnea)   Choledocholithiasis   Epigastric pain   #Common bile duct obstruction The patient had undergone gastric bypass surgery last fall, and she reported losing 110 pounds since that time. She was previously on prophylactic ursodiol, which she had stopped taking prior to admission.  The patient presented with generalized abdominal pain that had migrated to the right side her abdomen. Her liver function tests were noted to be elevated on presentation, and abdominal ultrasound demonstrated gallbladder sludge along with dilation of her common bile duct. She was not noted to have any fevers or jaundice during the hospitalization, and her lipase was normal prior to discharge.  GI was consulted and recommended MRCP, which again demonstrated common bile duct obstruction secondary to gallstones versus a small ampullary mass. GI consulted general surgery for possible cholecystectomy, but general surgery recommended ERCP prior to surgery. Due to the patient's  prior gastric bypass surgery, GI was unable to perform ERCP, and they recommended that she go to [facility] to undergo the procedure. Dr. Alvera Novel at [facility] was contacted and recommended that the patient follow-up with him as an outpatient to plan the procedure, which needs to be coordinated with general surgery.  He will contact the patient to schedule an appointment next week. Given the high risk of cholangitis with ongoing common bile duct obstruction, the patient was started on prophylactic ciprofloxacin, which was continued at discharge. She was also given Dilaudid for pain control and Zofran for nausea. She was instructed to seek medical care if she develops worsening abdominal pain, nausea, vomiting, fever, or chills. At follow-up, ensure that she is scheduled to see Dr. Amalia Hailey and check her liver function tests along with lipase to make sure that she does not develop pancreatitis.  #GERD The patient reported burning abdominal pain with meals, and she was started on Protonix during the hospitalization. This was continued at discharge.  #Thrombocytopenia On the day of discharge, the patient's platelet count dropped from 180 to 100. She had received 2 doses of enoxaparin during the admission, raising concern for HIT. However, her 4T score was consistent with low risk of HIT, so no workup was pursued. At follow up, recheck her platelet count to check for resolution of her thrombocytopenia.  #Anxiety/depression The patient reported recently stopping smoking, and she currently reported taking Wellbutrin and Zoloft at home to prevent her cravings and treat her anxiety/depression. These were continued during the hospitalization and at discharge.  Discharge Vitals:   BP 130/61 mmHg  Pulse 65  Temp(Src) 98.6 F (37 C) (Oral)  Resp 16  Ht 5\' 5"  (1.651 m)  Wt 177 lb 1.6 oz (80.332 kg)  BMI 29.47 kg/m2  SpO2 99%  Discharge Labs:  Results for orders placed or performed during the  hospital encounter of 01/01/15 (from the past 24 hour(s))  Comprehensive metabolic panel     Status: Abnormal   Collection Time: 01/03/15  4:18 AM  Result Value Ref Range   Sodium 136 135 - 145 mmol/L   Potassium 3.9 3.5 - 5.1 mmol/L   Chloride 102 101 - 111 mmol/L   CO2 24 22 - 32 mmol/L   Glucose, Bld 88 65 - 99 mg/dL   BUN 8 6 - 20 mg/dL   Creatinine, Ser 0.61 0.44 - 1.00 mg/dL  Calcium 8.5 (L) 8.9 - 10.3 mg/dL   Total Protein 5.2 (L) 6.5 - 8.1 g/dL   Albumin 2.8 (L) 3.5 - 5.0 g/dL   AST 135 (H) 15 - 41 U/L   ALT 359 (H) 14 - 54 U/L   Alkaline Phosphatase 246 (H) 38 - 126 U/L   Total Bilirubin 0.4 0.3 - 1.2 mg/dL   GFR calc non Af Amer >60 >60 mL/min   GFR calc Af Amer >60 >60 mL/min   Anion gap 10 5 - 15  Lipase, blood     Status: Abnormal   Collection Time: 01/03/15  4:18 AM  Result Value Ref Range   Lipase 13 (L) 22 - 51 U/L  CBC     Status: Abnormal   Collection Time: 01/03/15  4:18 AM  Result Value Ref Range   WBC 4.5 4.0 - 10.5 K/uL   RBC 4.10 3.87 - 5.11 MIL/uL   Hemoglobin 12.4 12.0 - 15.0 g/dL   HCT 37.6 36.0 - 46.0 %   MCV 91.7 78.0 - 100.0 fL   MCH 30.2 26.0 - 34.0 pg   MCHC 33.0 30.0 - 36.0 g/dL   RDW 13.1 11.5 - 15.5 %   Platelets 101 (L) 150 - 400 K/uL    Signed: Charlesetta Shanks, MD 01/03/2015, 4:02 PM    Services Ordered on Discharge: None. Equipment Ordered on Discharge: None.

## 2015-01-03 NOTE — Progress Notes (Signed)
Discharge home. Home discharge instruction given, no question verbalized. 

## 2015-01-03 NOTE — Progress Notes (Signed)
Subjective:    The patient feels well this morning with no abdominal pain, nausea, or vomiting. She reports feeling warm last night with a temperature of 99.8. She is anxious about the process of being seen at [facility] to arrange for ERCP, but she expressed being ready to go home after I explained the process.  Interval Events: -Temperature to 99.8.   Objective:    Vital Signs:   Temp:  [98.5 F (36.9 C)-99.8 F (37.7 C)] 98.8 F (37.1 C) (06/01 2112) Pulse Rate:  [59-72] 59 (06/02 0500) Resp:  [18-19] 18 (06/02 0500) BP: (104-117)/(49-58) 117/56 mmHg (06/02 0500) SpO2:  [95 %-98 %] 97 % (06/02 0500) Weight:  [177 lb 1.6 oz (80.332 kg)] 177 lb 1.6 oz (80.332 kg) (06/02 0500) Last BM Date: 01/02/15  24-hour weight change: Weight change: 7 lb 1.6 oz (3.221 kg)  Intake/Output:   Intake/Output Summary (Last 24 hours) at 01/03/15 1325 Last data filed at 01/03/15 0855  Gross per 24 hour  Intake   2040 ml  Output   1150 ml  Net    890 ml      Physical Exam: General: Well-developed, well-nourished, in no acute distress.  Lungs:  Normal respiratory effort. Clear to auscultation BL without crackles or wheezes.  Heart: RRR. S1 and S2 normal without gallop, murmur, or rubs.  Abdomen:  Soft, Nondistended, non-tender. No rebound or guarding.  Extremities: No pretibial edema.     Labs:  Basic Metabolic Panel:  Recent Labs Lab 01/01/15 0132 01/02/15 0538 01/03/15 0418  NA 143 137 136  K 4.2 4.3 3.9  CL 105 102 102  CO2 29 26 24   GLUCOSE 146* 95 88  BUN 16 8 8   CREATININE 0.68 0.59 0.61  CALCIUM 9.6 8.6* 8.5*    Liver Function Tests:  Recent Labs Lab 01/01/15 0132 01/02/15 0538 01/03/15 0418  AST 203* 434* 135*  ALT 91* 612* 359*  ALKPHOS 213* 265* 246*  BILITOT 0.6 1.2 0.4  PROT 6.3* 5.0* 5.2*  ALBUMIN 3.8 3.0* 2.8*    Recent Labs Lab 01/01/15 0132 01/03/15 0418  LIPASE 24 13*   CBC:  Recent Labs Lab 01/01/15 0132 01/03/15 0418    WBC 8.7 4.5  NEUTROABS 5.1  --   HGB 14.6 12.4  HCT 43.9 37.6  MCV 91.3 91.7  PLT 182 101*    Cardiac Enzymes:  Recent Labs Lab 01/01/15 1040  TROPONINI <0.03   Microbiology: Results for orders placed or performed in visit on 12/08/12  WET PREP FOR [place], YEAST, CLUE     Status: None   Collection Time: 12/08/12  8:46 AM  Result Value Ref Range Status   Yeast Wet Prep HPF POC NONE SEEN NONE SEEN Final   Trich, Wet Prep NONE SEEN NONE SEEN Final   Clue Cells Wet Prep HPF POC NONE SEEN NONE SEEN Final   WBC, Wet Prep HPF POC RARE NONE SEEN Final    Comment: MODERATE BACTERIA SEEN (3-6) EPITH. CELLS PER FIELD AMINE NEGATIVE   Imaging: Mr 3d Recon At [employer]  01/02/2015   CLINICAL DATA:  59 year old female with abdominal pain, elevated liver function tests, pruritis and dilated bile ducts.  EXAM: MRI ABDOMEN WITHOUT AND WITH CONTRAST (INCLUDING MRCP)  TECHNIQUE: Multiplanar multisequence MR imaging of the abdomen was performed both before and after the administration of intravenous contrast. Heavily T2-weighted images of the biliary and pancreatic ducts were obtained, and three-dimensional MRCP images were rendered by post processing.  CONTRAST:  15mL MULTIHANCE GADOBENATE DIMEGLUMINE 529 MG/ML IV SOLN  COMPARISON:  CT of the abdomen and pelvis 01/01/2015.  FINDINGS: Lower chest:  Unremarkable.  Hepatobiliary: No cystic or solid hepatic lesions. MRCP images demonstrate mild intrahepatic biliary ductal dilatation. Common bile duct is also dilated to 10 mm in the porta hepatis. On some of the MRCP images, there is a suggestion of a small filling defect in the mid common bile duct (image 17 of series 14), however, this cannot be confirmed on additional pulse sequences. There is abrupt cut off of the distal common bile duct, with a suggestion of a filling defect, however, this is uncertain at also cannot be confirmed on other pulse sequences. Gallbladder is moderately distended, with no filling  defects to suggest gallstones. No gallbladder wall thickening or surrounding pericholecystic fluid to suggest an acute cholecystitis at this time.  Pancreas: MRCP images demonstrate no pancreatic ductal dilatation. No pancreatic mass identified. Specifically, no definite lesion in the head of the pancreas to explain the abrupt cut off of the distal common bile duct. No pancreatic or peripancreatic fluid or inflammatory changes. No peripancreatic lymphadenopathy.  Spleen: Unremarkable.  Adrenals/Urinary Tract: Bilateral adrenal glands and bilateral kidneys are normal in appearance.  Stomach/Bowel: Normal appearance of the stomach and visualized portions of small bowel and colon.  Vascular/Lymphatic: No aneurysm in the visualized abdominal vasculature. Retroaortic left renal vein (normal anatomical variant) incidentally noted. No lymphadenopathy noted in the abdomen.  Other: No significant volume of ascites in the visualized peritoneal cavity.  Musculoskeletal: No aggressive osseous lesions noted in the visualized portions of the skeleton.  IMPRESSION: 1. Obstruction of the distal common bile duct. This is of uncertain etiology. Some of the pulse sequences suggests the presence of ductal stones, however, this cannot be confirmed on additional pulse sequences. The possibility of a subtle distal ductal stones, or a very small distal ampullary lesion is not excluded, and correlation with ERCP is recommended at this time. No definite pancreatic head mass is noted, and there is no pancreatic ductal dilatation. Additionally, there are no gallstones in the gallbladder.   Electronically Signed   By: Vinnie Langton M.D.   On: 01/02/2015 08:37   Mr Jeananne Rama W/wo Cm/mrcp  01/02/2015   CLINICAL DATA:  59 year old female with abdominal pain, elevated liver function tests, pruritis and dilated bile ducts.  EXAM: MRI ABDOMEN WITHOUT AND WITH CONTRAST (INCLUDING MRCP)  TECHNIQUE: Multiplanar multisequence MR imaging of the abdomen was  performed both before and after the administration of intravenous contrast. Heavily T2-weighted images of the biliary and pancreatic ducts were obtained, and three-dimensional MRCP images were rendered by post processing.  CONTRAST:  26mL MULTIHANCE GADOBENATE DIMEGLUMINE 529 MG/ML IV SOLN  COMPARISON:  CT of the abdomen and pelvis 01/01/2015.  FINDINGS: Lower chest:  Unremarkable.  Hepatobiliary: No cystic or solid hepatic lesions. MRCP images demonstrate mild intrahepatic biliary ductal dilatation. Common bile duct is also dilated to 10 mm in the porta hepatis. On some of the MRCP images, there is a suggestion of a small filling defect in the mid common bile duct (image 17 of series 14), however, this cannot be confirmed on additional pulse sequences. There is abrupt cut off of the distal common bile duct, with a suggestion of a filling defect, however, this is uncertain at also cannot be confirmed on other pulse sequences. Gallbladder is moderately distended, with no filling defects to suggest gallstones. No gallbladder wall thickening or surrounding pericholecystic fluid to suggest an acute cholecystitis at this  time.  Pancreas: MRCP images demonstrate no pancreatic ductal dilatation. No pancreatic mass identified. Specifically, no definite lesion in the head of the pancreas to explain the abrupt cut off of the distal common bile duct. No pancreatic or peripancreatic fluid or inflammatory changes. No peripancreatic lymphadenopathy.  Spleen: Unremarkable.  Adrenals/Urinary Tract: Bilateral adrenal glands and bilateral kidneys are normal in appearance.  Stomach/Bowel: Normal appearance of the stomach and visualized portions of small bowel and colon.  Vascular/Lymphatic: No aneurysm in the visualized abdominal vasculature. Retroaortic left renal vein (normal anatomical variant) incidentally noted. No lymphadenopathy noted in the abdomen.  Other: No significant volume of ascites in the visualized peritoneal cavity.   Musculoskeletal: No aggressive osseous lesions noted in the visualized portions of the skeleton.  IMPRESSION: 1. Obstruction of the distal common bile duct. This is of uncertain etiology. Some of the pulse sequences suggests the presence of ductal stones, however, this cannot be confirmed on additional pulse sequences. The possibility of a subtle distal ductal stones, or a very small distal ampullary lesion is not excluded, and correlation with ERCP is recommended at this time. No definite pancreatic head mass is noted, and there is no pancreatic ductal dilatation. Additionally, there are no gallstones in the gallbladder.   Electronically Signed   By: Vinnie Langton M.D.   On: 01/02/2015 08:37       Medications:    Infusions: . sodium chloride 100 mL/hr at 01/02/15 1037    Scheduled Medications: . buPROPion  75 mg Oral BID  . ciprofloxacin  500 mg Oral BID  . multivitamin with minerals  1 tablet Oral Daily  . pantoprazole  40 mg Oral Daily  . sertraline  50 mg Oral QHS    PRN Medications: calcium carbonate, HYDROmorphone (DILAUDID) injection, ondansetron (ZOFRAN) IV   Assessment/ Plan:    Principal Problem:   Common bile duct dilatation Active Problems:   Type 2 diabetes mellitus   Hypercholesteremia   Abdominal pain   Transaminitis   OSA (obstructive sleep apnea)   Choledocholithiasis   Epigastric pain  #Common bile duct obstruction Liver function tests trending down, lipase normal, pain well controlled.  The patient is unable to undergo ERCP here due to her previous gastric bypass surgery. This needs to be arranged at [facility], but this will take some time and will need to be arranged outpatient. She will need an ERCP of some kind to relieve the bile duct obstruction prior to undergoing cholecystectomy. GI is okay with charge home today, and Dr. Amalia Hailey at [facility] will call the patient to arrange an outpatient follow-up. The main concerns with ongoing bile duct  obstruction are cholangitis, pancreatitis, or worsening uncontrolled pain. We'll discharge home with prophylactic anti-biotics and strict return precautions. -Discharge home with Dilaudid 2 mg every 4 hours as needed for severe pain. -Ciprofloxacin 500 mg twice a day. -Ondansetron 4 mg every 8 hours as needed for nausea or vomiting. -Continue bariatric full liquid diet. -Arranged outpatient follow-up with her PCP to ensure that she has been scheduled for ERCP. -Dr. Amalia Hailey will contact patient with an appointment.  #Thrombocytopenia The patient received 2 doses of Lovenox. However, her 4T score is 3 consistent with low risk of HIT (<2%), based largely on the timeframe of her heparin exposure. This may be related to her liver disease versus a stress or medication reaction. -No further workup. -We'll have PCP repeat CBC at follow-up.  #Headaches Likely tension versus secondary to Dilaudid. -K pad when necessary.  #Type 2  diabetes Blood glucose stable. -Continue to monitor.  #Anxiety/depression -Continue Zoloft 50 mg daily at bedtime.  #Tobacco abuse -Continue Wellbutrin 75 mg twice a day.  #Status post gastric bypass surgery Per outside records, the patient underwent both gastric sleeve and bypass surgeries. -Unable to undergo conventional ERCP.  -High-protein diet per patient.   DVT PPX - SCD's while in bed  CODE STATUS - Full.  CONSULTS PLACED - GI, general surgery.  DISPO - Will attempt to arrange transfer to [facility] today for ERCP.  The patient does have a current PCP Elon Alas J, NP) and does not need an [facility] hospital follow-up appointment after discharge.    Is the [facility] hospital follow-up appointment a one-time only appointment? not applicable.  Does the patient have transportation limitations that hinder transportation to clinic appointments? no   SERVICE NEEDED AT [facility]         Y = Yes, Blank = No PT:   OT:   RN:     Equipment:   Other:      Length of Stay: 2 day(s)   Signed: Charlesetta Shanks, MD  PGY-1, Internal Medicine Resident Pager: [phone] (7AM-5PM) 01/03/2015, 1:25 PM

## 2015-01-03 NOTE — Progress Notes (Signed)
Daily Rounding Note  01/03/2015, 10:35 AM  LOS: 2 days   SUBJECTIVE:       Some abdominal pain but overall improved.  No n/v, tolerating her bariatric diet.   OBJECTIVE:         Vital signs in last 24 hours:    Temp:  [98.5 F (36.9 C)-99.8 F (37.7 C)] 98.8 F (37.1 C) (06/01 2112) Pulse Rate:  [59-72] 59 (06/02 0500) Resp:  [18-19] 18 (06/02 0500) BP: (104-117)/(49-58) 117/56 mmHg (06/02 0500) SpO2:  [95 %-98 %] 97 % (06/02 0500) Weight:  [177 lb 1.6 oz (80.332 kg)] 177 lb 1.6 oz (80.332 kg) (06/02 0500) Last BM Date: 01/02/15 Filed Weights   01/01/15 0125 01/02/15 4696 01/03/15 0500  Weight: 151 lb (68.493 kg) 170 lb (77.111 kg) 177 lb 1.6 oz (80.332 kg)   General: pleasant, comfortable.  Not ill looking   Heart: RRR Chest: clear, no labored breathing.  Abdomen: soft, active BS. Tender RUQ/epigastrium but no rebound/guarding.    Extremities: no CCE Neuro/Psych:  Oriented x 3.  Alert, animated. Anxious.  No gross neuro deficits.   Intake/Output from previous day: 06/01 0701 - 06/02 0700 In: 1800 [P.O.:600; I.V.:1200] Out: 150 [Urine:150]  Intake/Output this shift: Total I/O In: 240 [P.O.:240] Out: 1000 [Urine:1000]  Lab Results:  Recent Labs  01/01/15 0132 01/03/15 0418  WBC 8.7 4.5  HGB 14.6 12.4  HCT 43.9 37.6  PLT 182 101*   BMET  Recent Labs  01/01/15 0132 01/02/15 0538 01/03/15 0418  NA 143 137 136  K 4.2 4.3 3.9  CL 105 102 102  CO2 29 26 24   GLUCOSE 146* 95 88  BUN 16 8 8   CREATININE 0.68 0.59 0.61  CALCIUM 9.6 8.6* 8.5*   LFT  Recent Labs  01/01/15 0132 01/02/15 0538 01/03/15 0418  PROT 6.3* 5.0* 5.2*  ALBUMIN 3.8 3.0* 2.8*  AST 203* 434* 135*  ALT 91* 612* 359*  ALKPHOS 213* 265* 246*  BILITOT 0.6 1.2 0.4    Studies/Results: Mr 3d Recon At [employer] Mr Abd W/wo Cm/mrcp 01/02/2015   CLINICAL DATA:  59 year old female with abdominal pain, elevated liver function  tests, pruritis and dilated bile ducts.  EXAM: MRI ABDOMEN WITHOUT AND WITH CONTRAST (INCLUDING MRCP)  TECHNIQUE: Multiplanar multisequence MR imaging of the abdomen was performed both before and after the administration of intravenous contrast. Heavily T2-weighted images of the biliary and pancreatic ducts were obtained, and three-dimensional MRCP images were rendered by post processing.  CONTRAST:  24mL MULTIHANCE GADOBENATE DIMEGLUMINE 529 MG/ML IV SOLN  COMPARISON:  CT of the abdomen and pelvis 01/01/2015.  FINDINGS: Lower chest:  Unremarkable.  Hepatobiliary: No cystic or solid hepatic lesions. MRCP images demonstrate mild intrahepatic biliary ductal dilatation. Common bile duct is also dilated to 10 mm in the porta hepatis. On some of the MRCP images, there is a suggestion of a small filling defect in the mid common bile duct (image 17 of series 14), however, this cannot be confirmed on additional pulse sequences. There is abrupt cut off of the distal common bile duct, with a suggestion of a filling defect, however, this is uncertain at also cannot be confirmed on other pulse sequences. Gallbladder is moderately distended, with no filling defects to suggest gallstones. No gallbladder wall thickening or surrounding pericholecystic fluid to suggest an acute cholecystitis at this time.  Pancreas: MRCP images demonstrate no pancreatic ductal dilatation. No pancreatic mass identified. Specifically, no definite  lesion in the head of the pancreas to explain the abrupt cut off of the distal common bile duct. No pancreatic or peripancreatic fluid or inflammatory changes. No peripancreatic lymphadenopathy.  Spleen: Unremarkable.  Adrenals/Urinary Tract: Bilateral adrenal glands and bilateral kidneys are normal in appearance.  Stomach/Bowel: Normal appearance of the stomach and visualized portions of small bowel and colon.  Vascular/Lymphatic: No aneurysm in the visualized abdominal vasculature. Retroaortic left renal  vein (normal anatomical variant) incidentally noted. No lymphadenopathy noted in the abdomen.  Other: No significant volume of ascites in the visualized peritoneal cavity.  Musculoskeletal: No aggressive osseous lesions noted in the visualized portions of the skeleton.  IMPRESSION: 1. Obstruction of the distal common bile duct. This is of uncertain etiology. Some of the pulse sequences suggests the presence of ductal stones, however, this cannot be confirmed on additional pulse sequences. The possibility of a subtle distal ductal stones, or a very small distal ampullary lesion is not excluded, and correlation with ERCP is recommended at this time. No definite pancreatic head mass is noted, and there is no pancreatic ductal dilatation. Additionally, there are no gallstones in the gallbladder.   Electronically Signed   By: Vinnie Langton M.D.   On: 01/02/2015 08:37       ASSESMENT:   *  Choledocholithiasis with CBD obstruction, without pancreatitis.  Cholelithiasis. Cipro day 3.  LFTs improved.  Clinically improved.   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  ~ 111 # weight loss since then.   *  Thrombocytopenia, acutely. Non-critical.    PLAN   *  Discharge today.  [facility] will be in contact with Heidi Pugh as to the ERCP/lap chole.   Note Rx printed for Dialudid.  Will also need rx for Cipro 500 mg BID, would write for at least 7 days worth.     Heidi Pugh  01/03/2015, 10:35 AM Pager: [phone] Attending MD note:   I have taken a history,  and reviewed the chart. I agree with the Advanced Practitioner's impression and recommendations. LFT's trending down. Dr Jerl Santos at [facility] will contact pt about outpatient appointment.  [facility] Pager # [phone]

## 2015-01-08 ENCOUNTER — Ambulatory Visit: Payer: Self-pay | Admitting: Women's Health

## 2015-03-29 ENCOUNTER — Other Ambulatory Visit (INDEPENDENT_AMBULATORY_CARE_PROVIDER_SITE_OTHER): Payer: 59 | Admitting: Ophthalmology

## 2015-03-29 DIAGNOSIS — H43813 Vitreous degeneration, bilateral: Secondary | ICD-10-CM | POA: Diagnosis not present

## 2015-05-02 ENCOUNTER — Other Ambulatory Visit: Payer: Self-pay | Admitting: Women's Health

## 2015-06-04 DIAGNOSIS — M8580 Other specified disorders of bone density and structure, unspecified site: Secondary | ICD-10-CM

## 2015-06-04 HISTORY — DX: Other specified disorders of bone density and structure, unspecified site: M85.80

## 2015-06-11 ENCOUNTER — Telehealth: Payer: Self-pay | Admitting: *Deleted

## 2015-06-11 NOTE — Telephone Encounter (Signed)
Telephone call, denies fever, pain in jaw , minimal cough. Continue treating symptoms, increase rest. Reports weight is down to 122, reviewed importance of increasing calories in diet, will try to do a calorie count for few days to see where she is. Weight is down 140 pounds in one year from gastric sleeve.  CBC, CMET, vitamin D, TSH, lipid panel-please call in

## 2015-06-11 NOTE — Telephone Encounter (Signed)
Order faxed to [phone] [facility]

## 2015-06-11 NOTE — Telephone Encounter (Signed)
Pt has annual schedule on11/15/16 called c/o sinus infection, greenish mucus when blowing nose, nasal drainage. Pt also said she has lost 3 pounds and just doesn't feel good. Pt asked if her CBC, glucose, CMET, could be checked and the labcrop in [place] she provided me with the fax # if you approve. Taking tylenol and sudafed for sinus infection, states she is limited medication she can take due to her gastric sleeve. Please advise

## 2015-06-18 ENCOUNTER — Ambulatory Visit (INDEPENDENT_AMBULATORY_CARE_PROVIDER_SITE_OTHER): Payer: 59 | Admitting: Women's Health

## 2015-06-18 ENCOUNTER — Encounter: Payer: Self-pay | Admitting: Women's Health

## 2015-06-18 VITALS — BP 110/78 | Ht 65.0 in | Wt 122.0 lb

## 2015-06-18 DIAGNOSIS — Z13820 Encounter for screening for osteoporosis: Secondary | ICD-10-CM

## 2015-06-18 DIAGNOSIS — F329 Major depressive disorder, single episode, unspecified: Secondary | ICD-10-CM

## 2015-06-18 DIAGNOSIS — Z01419 Encounter for gynecological examination (general) (routine) without abnormal findings: Secondary | ICD-10-CM | POA: Diagnosis not present

## 2015-06-18 DIAGNOSIS — F4323 Adjustment disorder with mixed anxiety and depressed mood: Secondary | ICD-10-CM

## 2015-06-18 DIAGNOSIS — K8050 Calculus of bile duct without cholangitis or cholecystitis without obstruction: Secondary | ICD-10-CM | POA: Diagnosis not present

## 2015-06-18 DIAGNOSIS — F32A Depression, unspecified: Secondary | ICD-10-CM

## 2015-06-18 MED ORDER — SERTRALINE HCL 50 MG PO TABS: 50.0000 mg | ORAL_TABLET | Freq: Every day | ORAL | Status: DC

## 2015-06-18 MED ORDER — BUPROPION HCL 75 MG PO TABS: ORAL_TABLET | ORAL | Status: DC

## 2015-06-18 NOTE — Progress Notes (Signed)
Heidi Pugh 1955/12/16 [id number]    History:    Presents for annual exam.  2005 TVH  BSO for endometriosis and DUB on no HRT. Normal Pap and mammogram history. 2015 negative colonoscopy. Anxiety and depression stable on Zoloft and Wellbutrin. Has lost 140 pounds after gastric sleeve one year ago.  2015 cholecystectomy.  Past medical history, past surgical history, family history and social history were all reviewed and documented in the EPIC chart.   ROS:  A ROS was performed and pertinent positives and negatives are included.  Exam:  Filed Vitals:   06/18/15 0936  BP: 110/78    General appearance:  Normal Thyroid:  Symmetrical, normal in size, without palpable masses or nodularity. Respiratory  Auscultation:  Clear without wheezing or rhonchi Cardiovascular  Auscultation:  Regular rate, without rubs, murmurs or gallops  Edema/varicosities:  Not grossly evident Abdominal  Soft,nontender, without masses, guarding or rebound.  Liver/spleen:  No organomegaly noted  Hernia:  None appreciated  Skin  Inspection:  Grossly normal   Breasts: Examined lying and sitting.     Right: Without masses, retractions, discharge or axillary adenopathy.     Left: Without masses, retractions, discharge or axillary adenopathy. Gentitourinary   Inguinal/mons:  Normal without inguinal adenopathy  External genitalia:  Normal  BUS/Urethra/Skene's glands:  Normal  Vagina:  Normal  Cervix:   and uterus absent Adnexa/parametria:     Rt: Without masses or tenderness.   Lt: Without masses or tenderness.  Anus and perineum: Normal  Digital rectal exam: Normal sphincter tone without palpated masses or tenderness  Assessment/Plan:  59 y.o. MWF G2 P1  for annual exam.    2005 TVH with BSO for endometriosis and DEB Anxiety and depression stable on Wellbutrin and Zoloft Gastric sleeve-140 pound weight loss  Plan: Had labs drawn 2 weeks ago, reviewed normal CBC, glucose,  TSH .  liver enzymes  slightly elevated will repeat CMP in one month. Vitamin D 26, instructed to take 2000 vitamin D over-the-counter daily. Wellbutrin 75 and Zoloft 50 mg prescriptions for both given, denies need for further counseling doing well, Continue daily walking, calcium rich foods, increase calories to maintain current weight. Reviewed importance to not lose any more weight. SBE's, annual screening mammogram overdue instructed to schedule. DEXA, instructed to schedule. Home safety, fall prevention and importance of weightbearing exercise reviewed. Zostavax at age 59. UA.   Huel Cote WHNP, 1:11 PM 06/18/2015

## 2015-06-18 NOTE — Patient Instructions (Signed)

## 2015-06-20 ENCOUNTER — Ambulatory Visit (INDEPENDENT_AMBULATORY_CARE_PROVIDER_SITE_OTHER): Payer: 59

## 2015-06-20 ENCOUNTER — Other Ambulatory Visit: Payer: Self-pay | Admitting: Women's Health

## 2015-06-20 DIAGNOSIS — Z13820 Encounter for screening for osteoporosis: Secondary | ICD-10-CM

## 2015-06-20 DIAGNOSIS — M8580 Other specified disorders of bone density and structure, unspecified site: Secondary | ICD-10-CM

## 2015-06-20 DIAGNOSIS — M899 Disorder of bone, unspecified: Secondary | ICD-10-CM

## 2015-06-20 DIAGNOSIS — M8589 Other specified disorders of bone density and structure, multiple sites: Secondary | ICD-10-CM

## 2015-06-24 ENCOUNTER — Encounter: Payer: Self-pay | Admitting: Gynecology

## 2015-06-26 ENCOUNTER — Telehealth: Payer: Self-pay | Admitting: Internal Medicine

## 2015-06-26 NOTE — Telephone Encounter (Signed)
Former Dr Olevia Perches patient. She has recently seen Dr Volanda Napoleon who is her doctor at the [facility] at [facility]. He told her she needed to see her GI due to elevated LFT's. Her records are viewable through [facility]. Appointment scheduled for 07/09/15 at 2:00 pm.

## 2015-07-03 ENCOUNTER — Other Ambulatory Visit: Payer: Self-pay

## 2015-07-03 ENCOUNTER — Encounter: Payer: Self-pay | Admitting: Gastroenterology

## 2015-07-03 DIAGNOSIS — Z1231 Encounter for screening mammogram for malignant neoplasm of breast: Secondary | ICD-10-CM

## 2015-07-09 ENCOUNTER — Ambulatory Visit (INDEPENDENT_AMBULATORY_CARE_PROVIDER_SITE_OTHER): Payer: 59 | Admitting: Gastroenterology

## 2015-07-09 ENCOUNTER — Encounter: Payer: Self-pay | Admitting: Gastroenterology

## 2015-07-09 VITALS — BP 108/72 | HR 66 | Ht 65.0 in | Wt 123.0 lb

## 2015-07-09 DIAGNOSIS — R7989 Other specified abnormal findings of blood chemistry: Secondary | ICD-10-CM | POA: Insufficient documentation

## 2015-07-09 DIAGNOSIS — R945 Abnormal results of liver function studies: Principal | ICD-10-CM

## 2015-07-09 NOTE — Progress Notes (Signed)
     07/09/2015 MARKELLE ZARAGOZA [id number] August 28, 1955   History of Present Illness:  This is a 59 year old female who is previously known to Dr. Olevia Perches for colonoscopy in 05/2014 at which time the study was normal.  Her care will be assumed by Dr. Silverio Decamp in Dr. Nichola Sizer absence.  She presents to our office today at the request of her bariatric surgeon, Dr. Volanda Napoleon, for evaluation regarding elevated LFTs.  When her LFTs were checked on November 16 as part of routine labs, she had a normal total bilirubin of 0.3, elevated alkaline phosphatase at 189, AST 74, ALT 124. In May 2016 she had an elevated alkaline phosphatase of 164, but other LFTs were normal. At that time she was having gallbladder/biliary issues and underwent an MRI abdomen/MRCP, which did not show any hepatic or pancreatic abnormalities but showed CBD stones for which she ended up having ERCP/lap chole at [facility] (needed there because of gastric sleeve). She denies any alcohol use or new medications. She denies any abdominal pain or GI symptoms.  Current Medications, Allergies, Past Medical History, Past Surgical History, Family History and Social History were reviewed in [employer] record.   Physical Exam: BP 108/72 mmHg  Pulse 66  Ht 5\' 5"  (1.651 m)  Wt 123 lb (55.792 kg)  BMI 20.47 kg/m2 General: Well developed white female in no acute distress Head: Normocephalic and atraumatic Eyes:  Sclerae anicteric, conjunctiva pink  Ears: Normal auditory acuity Lungs: Clear throughout to auscultation Heart: Regular rate and rhythm Abdomen: Soft, non-distended.  Normal bowel sounds.  Non-tender. Musculoskeletal: Symmetrical with no gross deformities  Extremities: No edema  Neurological: Alert oriented x 4, grossly non-focal Psychological:  Alert and cooperative. Normal mood and affect  Assessment and Recommendations: -Elevated LFT's:  This is a new elevation, really on just one occasion but with slightly  elevated ALP 6 months ago.  MRI abdomen/MRCP ok at that time except for CBD stone.  Will check abdominal ultrasound to compare to previous MRI and make sure there are not any changes.  Will recheck LFT's as well as some liver serologies to include viral hepatitis studies, ANA, AMA, ASMA, and iron studies.

## 2015-07-09 NOTE — Patient Instructions (Signed)
You have been scheduled for an abdominal ultrasound at [facility]iology (1st floor of hospital) on 07/11/2015 at 9:00am. Please arrive 15 minutes prior to your appointment for registration. Make certain not to have anything to eat or drink 6 hours prior to your appointment. Should you need to reschedule your appointment, please contact radiology at [phone]. This test typically takes about 30 minutes to perform.  Please go have labs drawn at [facility].

## 2015-07-11 ENCOUNTER — Ambulatory Visit (HOSPITAL_COMMUNITY)
Admission: RE | Admit: 2015-07-11 | Discharge: 2015-07-11 | Disposition: A | Discharge status: Home / Self Care | Payer: 59 | Source: Ambulatory Visit | Attending: Gastroenterology | Admitting: Gastroenterology

## 2015-07-11 ENCOUNTER — Ambulatory Visit (HOSPITAL_COMMUNITY): Payer: 59

## 2015-07-11 DIAGNOSIS — R945 Abnormal results of liver function studies: Secondary | ICD-10-CM

## 2015-07-11 DIAGNOSIS — R7989 Other specified abnormal findings of blood chemistry: Secondary | ICD-10-CM

## 2015-07-12 NOTE — Progress Notes (Signed)
Reviewed and agree with documentation and assessment and plan. K. Veena Rowin Bayron , MD   

## 2015-07-19 ENCOUNTER — Telehealth: Payer: Self-pay | Admitting: Gastroenterology

## 2015-07-19 ENCOUNTER — Ambulatory Visit
Admission: RE | Admit: 2015-07-19 | Discharge: 2015-07-19 | Disposition: A | Discharge status: Home / Self Care | Payer: 59 | Source: Ambulatory Visit

## 2015-07-19 DIAGNOSIS — Z1231 Encounter for screening mammogram for malignant neoplasm of breast: Secondary | ICD-10-CM

## 2015-07-19 NOTE — Telephone Encounter (Signed)
                                                               Patient states she has had an episode of upper abdominal this AM that lasted 15 minutes then went away. States she has had a couple of these attacks. She is wondering if she needs to start an antacid. She states it is not chest pain. She will go to ED or Urgent Care for evaluation if occurs over weekend.

## 2015-07-23 NOTE — Telephone Encounter (Signed)
Please let the patient know that all of her liver lab evaluation came back normal/negative, but LFT's were still slightly elevated.  With the episodes of pain that she is having I am wondering if she is having gallstone issues.  Did she have her gallbladder removed for stones in the past?  Can you please find out more about these attacks?  When I saw her she denied any complaints.  How many has she had?  When was the last one?  How long do they last?

## 2015-07-23 NOTE — Telephone Encounter (Signed)
                                                                                                                                                                                                                          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 Reports the attacks feel just like it did when she had to have her gallbladder removed. She reports 3 attacks with the last one being 3 days ago. The attack lasts about 15 minutes. She states she is losing her insurance in January and will be going to [place]. She would like to have this resolved. Please, advise.

## 2015-07-24 ENCOUNTER — Other Ambulatory Visit: Payer: Self-pay | Admitting: *Deleted

## 2015-07-24 DIAGNOSIS — R109 Unspecified abdominal pain: Secondary | ICD-10-CM

## 2015-07-24 NOTE — Telephone Encounter (Signed)
Spoke with Dr. Silverio Decamp.  Let's get MRI abdomen/MRCP to be sure that she does not have any recurrent stones in her bile duct.

## 2015-07-24 NOTE — Telephone Encounter (Signed)
Spoke with patient and gave her appointment for MRI/MRCP at [facility] on 08/02/15 at 9:00 PM. NPO 4 hours prior. Patient aware.

## 2015-08-02 ENCOUNTER — Other Ambulatory Visit: Payer: Self-pay | Admitting: Gastroenterology

## 2015-08-02 ENCOUNTER — Ambulatory Visit (HOSPITAL_COMMUNITY)
Admission: RE | Admit: 2015-08-02 | Discharge: 2015-08-02 | Disposition: A | Discharge status: Home / Self Care | Payer: 59 | Source: Ambulatory Visit | Attending: Gastroenterology | Admitting: Gastroenterology

## 2015-08-02 DIAGNOSIS — R109 Unspecified abdominal pain: Secondary | ICD-10-CM | POA: Insufficient documentation

## 2015-08-02 DIAGNOSIS — Z9049 Acquired absence of other specified parts of digestive tract: Secondary | ICD-10-CM | POA: Insufficient documentation

## 2015-08-02 LAB — POCT I-STAT CREATININE: Creatinine, Ser: 0.7 mg/dL (ref 0.44–1.00)

## 2015-08-02 MED ORDER — GADOBENATE DIMEGLUMINE 529 MG/ML IV SOLN
15.0000 mL | Freq: Once | INTRAVENOUS | Status: AC | PRN
  Administered 2015-08-02: 11 mL via INTRAVENOUS

## 2015-08-08 ENCOUNTER — Other Ambulatory Visit: Payer: Self-pay | Admitting: *Deleted

## 2015-09-11 ENCOUNTER — Other Ambulatory Visit: Payer: Self-pay | Admitting: *Deleted

## 2015-09-11 DIAGNOSIS — F32A Depression, unspecified: Secondary | ICD-10-CM

## 2015-09-11 DIAGNOSIS — F4323 Adjustment disorder with mixed anxiety and depressed mood: Secondary | ICD-10-CM

## 2015-09-11 DIAGNOSIS — F329 Major depressive disorder, single episode, unspecified: Secondary | ICD-10-CM

## 2015-09-11 MED ORDER — SERTRALINE HCL 50 MG PO TABS: 50.0000 mg | ORAL_TABLET | Freq: Every day | ORAL | Status: DC

## 2015-09-11 MED ORDER — BUPROPION HCL 75 MG PO TABS: ORAL_TABLET | ORAL | Status: DC

## 2015-09-11 NOTE — Telephone Encounter (Signed)
Pt is requesting vaginal estrogen. Intercourse has become very difficult. She states that her vaginal skin is very thin and intercourse was painful and lubricants didn't help that much. Please advise.

## 2015-09-13 ENCOUNTER — Other Ambulatory Visit: Payer: Self-pay | Admitting: Women's Health

## 2015-09-13 DIAGNOSIS — N952 Postmenopausal atrophic vaginitis: Secondary | ICD-10-CM

## 2015-09-13 MED ORDER — ESTRADIOL 2 MG VA RING: 2.0000 mg | VAGINAL_RING | VAGINAL | Status: DC

## 2015-09-13 NOTE — Telephone Encounter (Signed)
Telephone call, options reviewed will try the Estring 1 ring per vagina for 3 months prescription called in. Continue lubricants. Reviewed minimal systemic absorption. Risks of blood clots and strokes reviewed. No risk factors.

## 2015-09-17 ENCOUNTER — Telehealth: Payer: Self-pay | Admitting: *Deleted

## 2015-09-17 NOTE — Telephone Encounter (Signed)
Medication Estring needed Prior Authorization medication was denied 2 alternative medications must be tried and failed such as Vagifem, Premarin vaginal cream.

## 2015-11-22 ENCOUNTER — Telehealth: Payer: Self-pay | Admitting: *Deleted

## 2015-11-22 MED ORDER — ESTRADIOL 10 MCG VA TABS: 1.0000 | ORAL_TABLET | VAGINAL | Status: DC

## 2015-11-22 NOTE — Telephone Encounter (Signed)
Estring not approved by insurance wants to try Vagifem over vaginal estrogen cream.  Will send to pharmacy in [place]

## 2016-03-03 ENCOUNTER — Other Ambulatory Visit: Payer: Self-pay | Admitting: Women's Health

## 2016-03-03 DIAGNOSIS — F4323 Adjustment disorder with mixed anxiety and depressed mood: Secondary | ICD-10-CM

## 2016-03-03 NOTE — Telephone Encounter (Signed)
Okay for refill through November

## 2016-05-26 IMAGING — US US ABDOMEN COMPLETE
1 series · 13 of 25 positions shown · non-contrast
Comparison: None.

CLINICAL DATA: Epigastric pain since this evening.

EXAM:
ULTRASOUND ABDOMEN COMPLETE

[Series 1: us abdomen complete · 0.21mm/px · 13 of 91 slices shown]
[im 1/91]
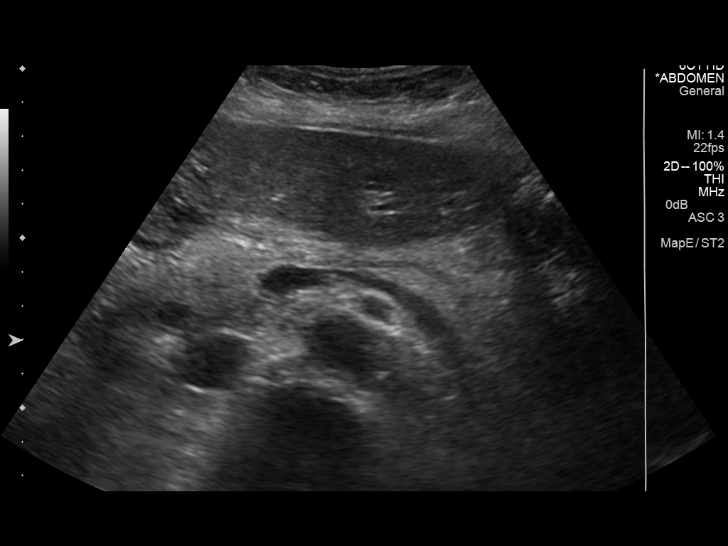
[im 8/91]
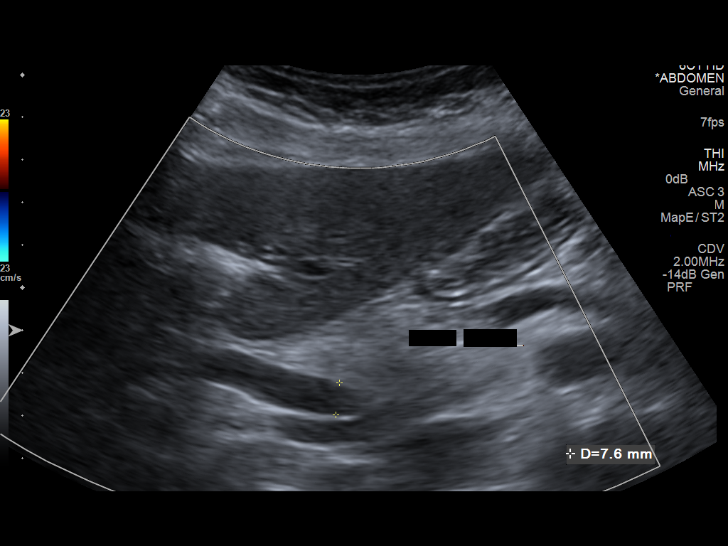
[im 16/91]
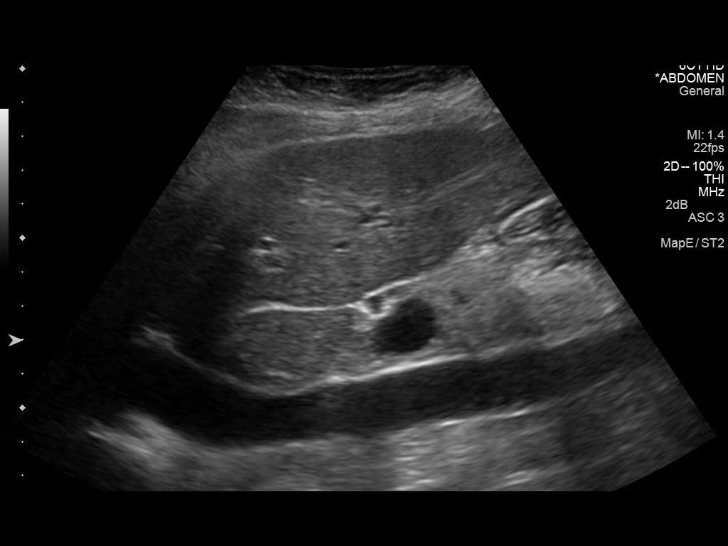
[im 23/91]
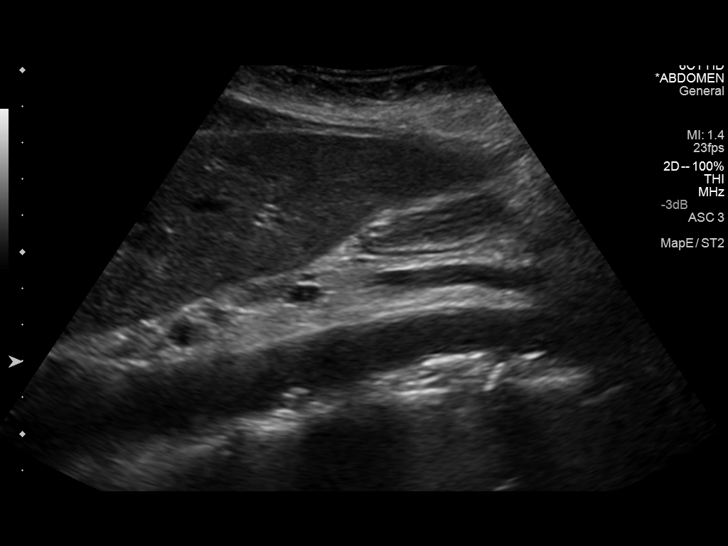
[im 31/91]
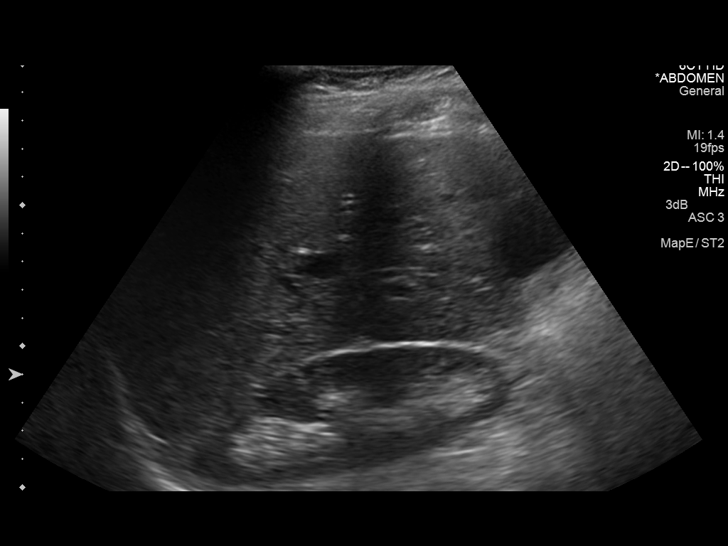
[im 38/91]
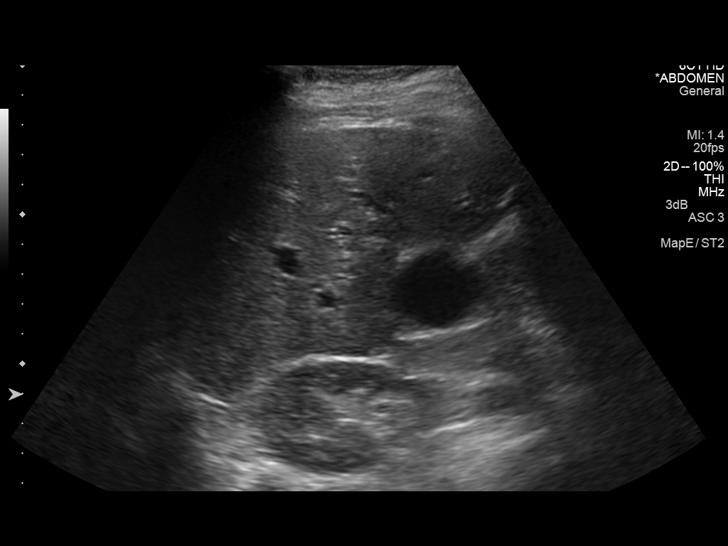
[im 46/91]
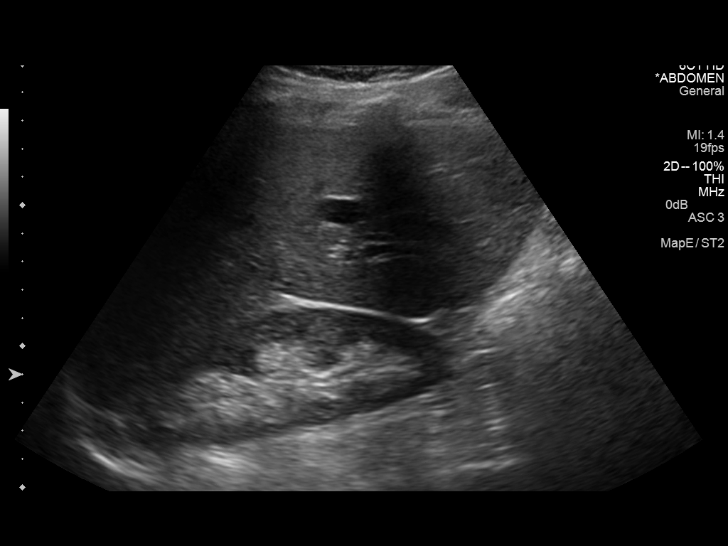
[im 53/91]
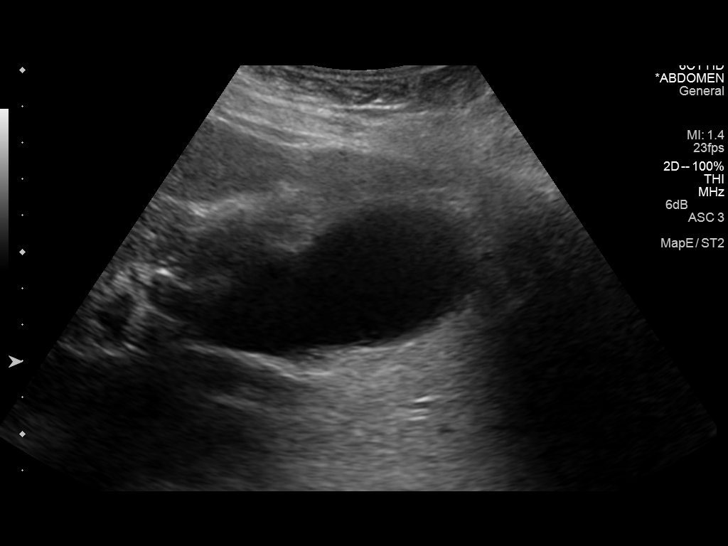
[im 61/91]
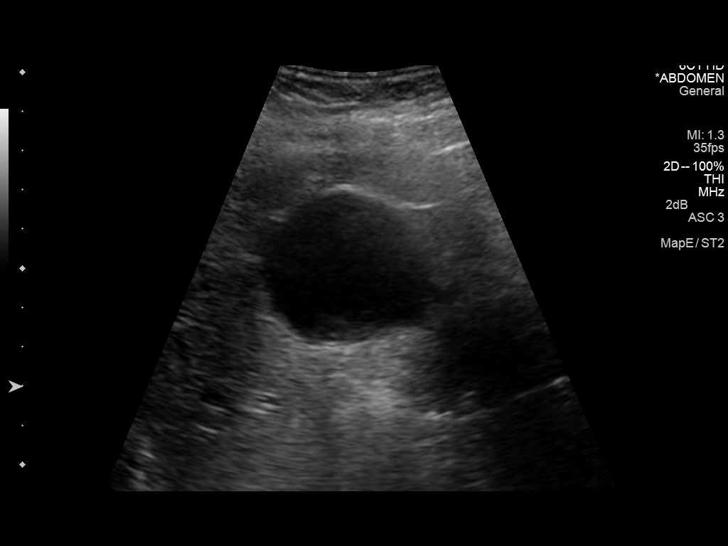
[im 68/91]
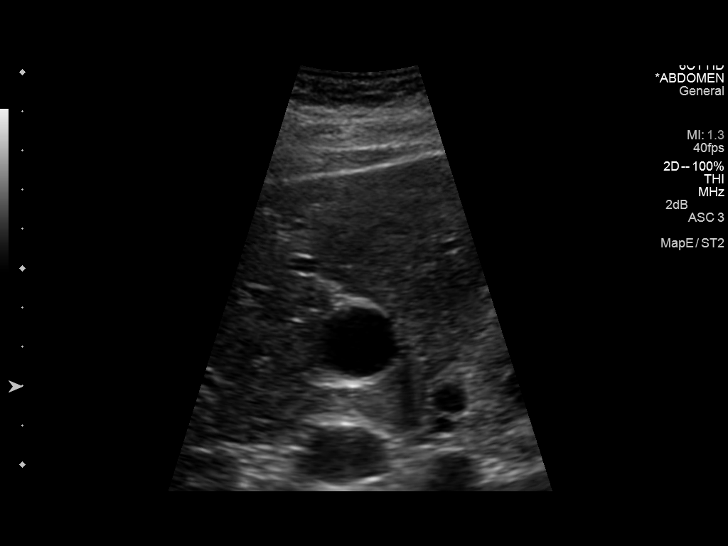
[im 76/91]
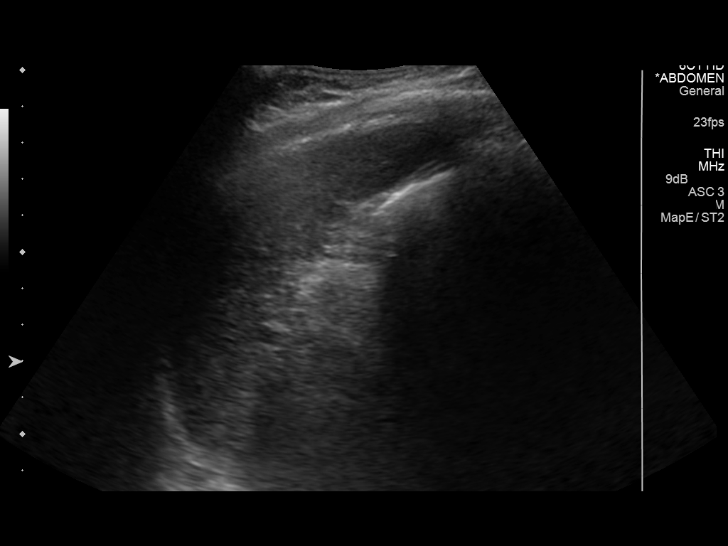
[im 83/91]
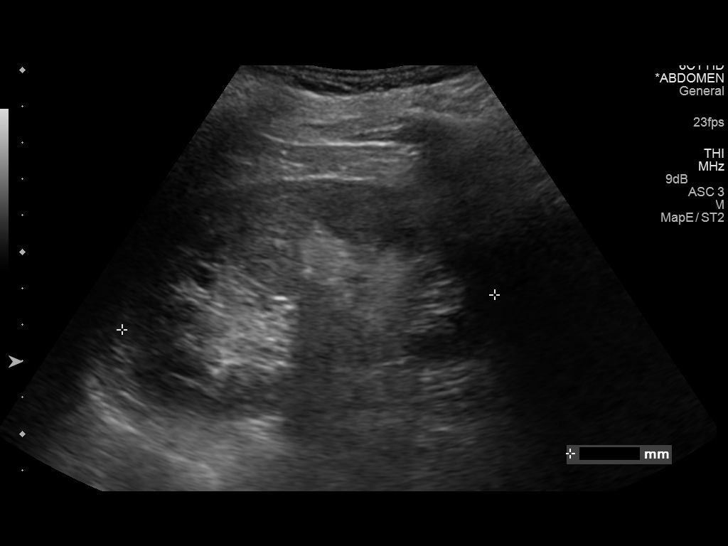
[im 91/91]
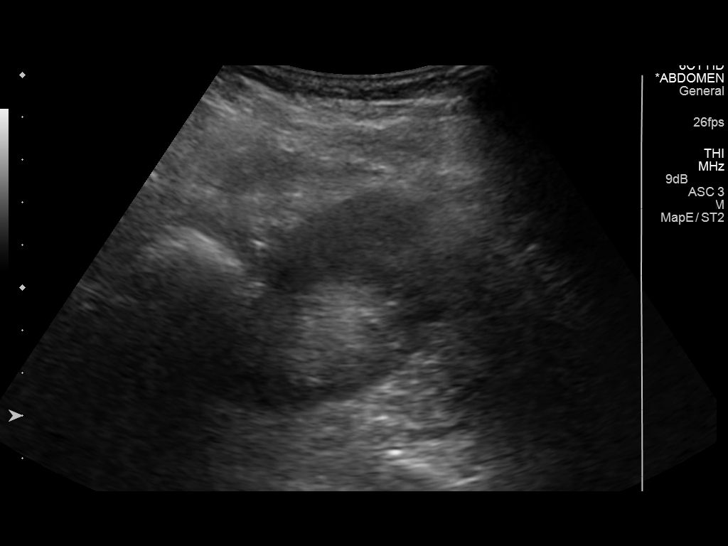

[13 of 25 positions shown; findings below may reference images not displayed]

FINDINGS: Gallbladder: Physiologically distended with sludge. No gallstones or
wall thickening visualized. No sonographic Murphy sign noted.

Common bile duct: Diameter: 5.3 mm proximally, flaring distally
measuring 7.6 mm.

Liver: No focal lesion identified. Within normal limits in
parenchymal echogenicity. There is no intrahepatic biliary ductal
dilatation.

IVC: No abnormality visualized.

Pancreas: Visualized portion unremarkable. Pancreatic duct is
visible however normal in caliber measuring loss in 2 mm.

Spleen: Size and appearance within normal limits.

Right Kidney: Length: 12.0 cm. Echogenicity within normal limits. No
mass or hydronephrosis visualized.

Left Kidney: Length: 10.3 cm. Echogenicity within normal limits. No
mass or hydronephrosis visualized.

Abdominal aorta: No aneurysm visualized.

Other findings: None.
IMPRESSION: 1. Physiologically distended gallbladder containing sludge. No
stones or findings of acute cholecystitis.
2. Flaring of the distal common bile duct, more proximal duct is
normal in caliber. This is likely physiologic in this patient.
Correlation with LFTs recommended.

## 2016-06-03 ENCOUNTER — Telehealth: Payer: Self-pay | Admitting: *Deleted

## 2016-06-03 NOTE — Telephone Encounter (Signed)
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 I advised to use a combination of OTC hydrocortisone cream and Lotrimin cream on the area 2-3 times a day. Keep the area dry.  Will keep me posted if area not better after a few days.  KW CMA

## 2016-08-21 ENCOUNTER — Other Ambulatory Visit: Payer: Self-pay | Admitting: *Deleted

## 2016-08-21 DIAGNOSIS — F4323 Adjustment disorder with mixed anxiety and depressed mood: Secondary | ICD-10-CM

## 2016-08-21 MED ORDER — SERTRALINE HCL 50 MG PO TABS: 50.0000 mg | ORAL_TABLET | Freq: Every day | ORAL | 2 refills | Status: DC

## 2016-08-21 NOTE — Telephone Encounter (Signed)
GGA - Pt called in [place] now, lives in a [place] and travels. Needs refill on Sertiline 50mg  #90 refill 2. Will be in town late May - June 2018 for annual exam KW CMA

## 2016-12-16 ENCOUNTER — Encounter: Payer: Self-pay | Admitting: Gynecology

## 2017-02-23 ENCOUNTER — Telehealth: Payer: Self-pay | Admitting: *Deleted

## 2017-02-23 MED ORDER — OMEPRAZOLE 20 MG PO CPDR: 20.0000 mg | DELAYED_RELEASE_CAPSULE | Freq: Two times a day (BID) | ORAL | 1 refills | Status: DC

## 2017-02-23 NOTE — Telephone Encounter (Signed)
Pt called with some epigastric pain. Seems to be waking her up at night, which is on an empty stomach.  Pt h/o gastric bypass surgery.  Eats a lot of fruit with high acid levels.  Does take OTC omperazole 20mg . Wants prescription for it as OTC is expensive.  Is going to take 2-3 times a day to get pain under control then back down to 1 a day. If pain doesn't go away will call back. KW CMA/[place]

## 2017-04-26 ENCOUNTER — Encounter: Payer: 59 | Admitting: Women's Health

## 2017-04-27 ENCOUNTER — Ambulatory Visit (INDEPENDENT_AMBULATORY_CARE_PROVIDER_SITE_OTHER): Payer: BLUE CROSS/BLUE SHIELD | Admitting: Women's Health

## 2017-04-27 ENCOUNTER — Encounter: Payer: Self-pay | Admitting: Women's Health

## 2017-04-27 VITALS — BP 100/70 | Ht 65.0 in | Wt 124.0 lb

## 2017-04-27 DIAGNOSIS — F329 Major depressive disorder, single episode, unspecified: Secondary | ICD-10-CM

## 2017-04-27 DIAGNOSIS — Z01419 Encounter for gynecological examination (general) (routine) without abnormal findings: Secondary | ICD-10-CM

## 2017-04-27 DIAGNOSIS — F4323 Adjustment disorder with mixed anxiety and depressed mood: Secondary | ICD-10-CM

## 2017-04-27 DIAGNOSIS — R739 Hyperglycemia, unspecified: Secondary | ICD-10-CM

## 2017-04-27 DIAGNOSIS — Z13220 Encounter for screening for lipoid disorders: Secondary | ICD-10-CM

## 2017-04-27 DIAGNOSIS — F32A Depression, unspecified: Secondary | ICD-10-CM

## 2017-04-27 MED ORDER — BUPROPION HCL 75 MG PO TABS: ORAL_TABLET | ORAL | 4 refills | Status: DC

## 2017-04-27 MED ORDER — SERTRALINE HCL 50 MG PO TABS: 50.0000 mg | ORAL_TABLET | Freq: Every day | ORAL | 2 refills | Status: DC

## 2017-04-27 NOTE — Progress Notes (Signed)
Heidi Pugh 1956-05-30 [id number]    History:    Presents for annual exam.  2005 TVH with BSO for endometriosis and DUB on no HRT. Normal Pap and mammogram history. 2015 negative colonoscopy. 2015 gastric sleeve lost 140 pounds and has maintained. Anxiety/ depression stable on Zoloft and Wellbutrin. Having problems with indigestion/heartburn/epigastric pain has had some relief with  Prilosec. Has had no vomiting, change in color of stool. 06/2015 minimal bone loss, T score -1.1 and hip, other sites normal, no elevated fracture risk.  Past medical history, past surgical history, family history and social history were all reviewed and documented in the EPIC chart. Spends part of her year in [place], [place] and [place].  ROS:  A ROS was performed and pertinent positives and negatives are included.  Exam:  Vitals:   04/27/17 1119  BP: 100/70  Weight: 124 lb (56.2 kg)  Height: 5\' 5"  (1.651 m)   Body mass index is 20.63 kg/m.   General appearance:  Normal Thyroid:  Symmetrical, normal in size, without palpable masses or nodularity. Respiratory  Auscultation:  Clear without wheezing or rhonchi Cardiovascular  Auscultation:  Regular rate, without rubs, murmurs or gallops  Edema/varicosities:  Not grossly evident Abdominal  Soft,nontender, without masses, guarding or rebound.  Liver/spleen:  No organomegaly noted  Hernia:  None appreciated  Skin  Inspection:  Grossly normal   Breasts: Examined lying and sitting.     Right: Without masses, retractions, discharge or axillary adenopathy.     Left: Without masses, retractions, discharge or axillary adenopathy. Gentitourinary   Inguinal/mons:  Normal without inguinal adenopathy  External genitalia:  2 Centimeter sebaceous cyst on left labia, area anesthetized with Xylocaine, opened with #11 knife blade, moderate amount of a white exudate, area flat. No bleeding.  BUS/Urethra/Skene's glands:  Normal  Vagina:  Normal  Cervix:  And  uterus absent  Adnexa/parametria:     Rt: Without masses or tenderness.   Lt: Without masses or tenderness.  Anus and perineum: Normal  Digital rectal exam: Normal sphincter tone without palpated masses or tenderness  Assessment/Plan:  61 y.o. MWF G2 P1  for annual exam with complaint of questionable stomach ulcer, occasional vertigo in a.m., reports elevated blood sugar  2005 TVH with BSO for endometriosis and DUB on no HRT Anxiety and depression stable Wellbutrin and Zoloft Gastric sleeve 140 pound weight loss, decreased appetite Epigastric pain partial relief with Prilosec History of diabetes, resolved after weight loss, occasional elevated blood sugar  Plan: Leaving for [place] today, declines referral to GI for evaluation of epigastric pain. Will continue Prilosec, bland diet encouraged. Wellbutrin 75 mg and Zoloft 50 mg prescription is given, counseling as needed SBE's, overdue for screening mammogram, reviewed importance of annual screen, instructed to schedule. Calcium rich diet, vitamin D 2000 daily, regular exercise encouraged.  Shingrex encouraged. CBC, CMP, lipid panel, hemoglobin A1c.   [name], 12:43 PM 04/27/2017

## 2017-04-27 NOTE — Patient Instructions (Signed)
Food Choices for Peptic Ulcer Disease When you have peptic ulcer disease, the foods you eat and your eating habits are very important. Choosing the right foods can help ease the discomfort of peptic ulcer disease. What general guidelines do I need to follow?  Choose fruits, vegetables, whole grains, and low-fat meat, fish, and poultry.  Keep a food diary to identify foods that cause symptoms.  Avoid foods that cause irritation or pain. These may be different for different people.  Eat frequent small meals instead of three large meals each day. The pain may be worse when your stomach is empty.  Avoid eating close to bedtime. What foods are not recommended? The following are some foods and drinks that may worsen your symptoms:  Black, white, and red pepper.  Hot sauce.  Chili peppers.  Chili powder.  Chocolate and cocoa.  Alcohol.  Tea, coffee, and cola (regular and decaffeinated).  The items listed above may not be a complete list of foods and beverages to avoid. Contact your dietitian for more information. This information is not intended to replace advice given to you by your health care provider. Make sure you discuss any questions you have with your health care provider. Document Released: 10/12/2011 Document Revised: 12/26/2015 Document Reviewed: 05/24/2013 Elsevier Interactive Patient Education  2017 [employer] Carbohydrate Counting for Diabetes Mellitus, Adult Carbohydrate counting is a method for keeping track of how many carbohydrates you eat. Eating carbohydrates naturally increases the amount of sugar (glucose) in the blood. Counting how many carbohydrates you eat helps keep your blood glucose within normal limits, which helps you manage your diabetes (diabetes mellitus). It is important to know how many carbohydrates you can safely have in each meal. This is different for every person. A diet and nutrition specialist (registered dietitian) can help you make a meal  plan and calculate how many carbohydrates you should have at each meal and snack. Carbohydrates are found in the following foods:  Grains, such as breads and cereals.  Dried beans and soy products.  Starchy vegetables, such as potatoes, peas, and corn.  Fruit and fruit juices.  Milk and yogurt.  Sweets and snack foods, such as cake, cookies, candy, chips, and soft drinks.  How do I count carbohydrates? There are two ways to count carbohydrates in food. You can use either of the methods or a combination of both. Reading "Nutrition Facts" on packaged food The "Nutrition Facts" list is included on the labels of almost all packaged foods and beverages in the [place] It includes:  The serving size.  Information about nutrients in each serving, including the grams (g) of carbohydrate per serving.  To use the "Nutrition Facts":  Decide how many servings you will have.  Multiply the number of servings by the number of carbohydrates per serving.  The resulting number is the total amount of carbohydrates that you will be having.  Learning standard serving sizes of other foods When you eat foods containing carbohydrates that are not packaged or do not include "Nutrition Facts" on the label, you need to measure the servings in order to count the amount of carbohydrates:  Measure the foods that you will eat with a food scale or measuring cup, if needed.  Decide how many standard-size servings you will eat.  Multiply the number of servings by 15. Most carbohydrate-rich foods have about 15 g of carbohydrates per serving. ? For example, if you eat 8 oz (170 g) of strawberries, you will have eaten 2 servings and   30 g of carbohydrates (2 servings x 15 g = 30 g).  For foods that have more than one food mixed, such as soups and casseroles, you must count the carbohydrates in each food that is included.  The following list contains standard serving sizes of common carbohydrate-rich foods. Each of  these servings has about 15 g of carbohydrates:   hamburger bun or  English muffin.   oz (15 mL) syrup.   oz (14 g) jelly.  1 slice of bread.  1 six-inch tortilla.  3 oz (85 g) cooked rice or pasta.  4 oz (113 g) cooked dried beans.  4 oz (113 g) starchy vegetable, such as peas, corn, or potatoes.  4 oz (113 g) hot cereal.  4 oz (113 g) mashed potatoes or  of a large baked potato.  4 oz (113 g) canned or frozen fruit.  4 oz (120 mL) fruit juice.  4-6 crackers.  6 chicken nuggets.  6 oz (170 g) unsweetened dry cereal.  6 oz (170 g) plain fat-free yogurt or yogurt sweetened with artificial sweeteners.  8 oz (240 mL) milk.  8 oz (170 g) fresh fruit or one small piece of fruit.  24 oz (680 g) popped popcorn.  Example of carbohydrate counting Sample meal  3 oz (85 g) chicken breast.  6 oz (170 g) brown rice.  4 oz (113 g) corn.  8 oz (240 mL) milk.  8 oz (170 g) strawberries with sugar-free whipped topping. Carbohydrate calculation 1. Identify the foods that contain carbohydrates: ? Rice. ? Corn. ? Milk. ? Strawberries. 2. Calculate how many servings you have of each food: ? 2 servings rice. ? 1 serving corn. ? 1 serving milk. ? 1 serving strawberries. 3. Multiply each number of servings by 15 g: ? 2 servings rice x 15 g = 30 g. ? 1 serving corn x 15 g = 15 g. ? 1 serving milk x 15 g = 15 g. ? 1 serving strawberries x 15 g = 15 g. 4. Add together all of the amounts to find the total grams of carbohydrates eaten: ? 30 g + 15 g + 15 g + 15 g = 75 g of carbohydrates total. This information is not intended to replace advice given to you by your health care provider. Make sure you discuss any questions you have with your health care provider. Document Released: 07/20/2005 Document Revised: 02/07/2016 Document Reviewed: 01/01/2016 Elsevier Interactive Patient Education  2018 [employer] Health Maintenance, Female Adopting a healthy lifestyle and  getting preventive care can go a long way to promote health and wellness. Talk with your health care provider about what schedule of regular examinations is right for you. This is a good chance for you to check in with your provider about disease prevention and staying healthy. In between checkups, there are plenty of things you can do on your own. Experts have done a lot of research about which lifestyle changes and preventive measures are most likely to keep you healthy. Ask your health care provider for more information. Weight and diet Eat a healthy diet  Be sure to include plenty of vegetables, fruits, low-fat dairy products, and lean protein.  Do not eat a lot of foods high in solid fats, added sugars, or salt.  Get regular exercise. This is one of the most important things you can do for your health. ? Most adults should exercise for at least 150 minutes each week. The exercise should increase your heart   rate and make you sweat (moderate-intensity exercise). ? Most adults should also do strengthening exercises at least twice a week. This is in addition to the moderate-intensity exercise.  Maintain a healthy weight  Body mass index (BMI) is a measurement that can be used to identify possible weight problems. It estimates body fat based on height and weight. Your health care provider can help determine your BMI and help you achieve or maintain a healthy weight.  For females 44 years of age and older: ? A BMI below 18.5 is considered underweight. ? A BMI of 18.5 to 24.9 is normal. ? A BMI of 25 to 29.9 is considered overweight. ? A BMI of 30 and above is considered obese.  Watch levels of cholesterol and blood lipids  You should start having your blood tested for lipids and cholesterol at 62 years of age, then have this test every 5 years.  You may need to have your cholesterol levels checked more often if: ? Your lipid or cholesterol levels are high. ? You are older than 62 years of  age. ? You are at high risk for heart disease.  Cancer screening Lung Cancer  Lung cancer screening is recommended for adults 92-74 years old who are at high risk for lung cancer because of a history of smoking.  A yearly low-dose CT scan of the lungs is recommended for people who: ? Currently smoke. ? Have quit within the past 15 years. ? Have at least a 30-pack-year history of smoking. A pack year is smoking an average of one pack of cigarettes a day for 1 year.  Yearly screening should continue until it has been 15 years since you quit.  Yearly screening should stop if you develop a health problem that would prevent you from having lung cancer treatment.  Breast Cancer  Practice breast self-awareness. This means understanding how your breasts normally appear and feel.  It also means doing regular breast self-exams. Let your health care provider know about any changes, no matter how small.  If you are in your 20s or 30s, you should have a clinical breast exam (CBE) by a health care provider every 1-3 years as part of a regular health exam.  If you are 41 or older, have a CBE every year. Also consider having a breast X-ray (mammogram) every year.  If you have a family history of breast cancer, talk to your health care provider about genetic screening.  If you are at high risk for breast cancer, talk to your health care provider about having an MRI and a mammogram every year.  Breast cancer gene (BRCA) assessment is recommended for women who have family members with BRCA-related cancers. BRCA-related cancers include: ? Breast. ? Ovarian. ? Tubal. ? Peritoneal cancers.  Results of the assessment will determine the need for genetic counseling and BRCA1 and BRCA2 testing.  Cervical Cancer Your health care provider may recommend that you be screened regularly for cancer of the pelvic organs (ovaries, uterus, and vagina). This screening involves a pelvic examination, including  checking for microscopic changes to the surface of your cervix (Pap test). You may be encouraged to have this screening done every 3 years, beginning at age 36.  For women ages 55-65, health care providers may recommend pelvic exams and Pap testing every 3 years, or they may recommend the Pap and pelvic exam, combined with testing for human papilloma virus (HPV), every 5 years. Some types of HPV increase your risk of cervical cancer. Testing  for HPV may also be done on women of any age with unclear Pap test results.  Other health care providers may not recommend any screening for nonpregnant women who are considered low risk for pelvic cancer and who do not have symptoms. Ask your health care provider if a screening pelvic exam is right for you.  If you have had past treatment for cervical cancer or a condition that could lead to cancer, you need Pap tests and screening for cancer for at least 20 years after your treatment. If Pap tests have been discontinued, your risk factors (such as having a new sexual partner) need to be reassessed to determine if screening should resume. Some women have medical problems that increase the chance of getting cervical cancer. In these cases, your health care provider may recommend more frequent screening and Pap tests.  Colorectal Cancer  This type of cancer can be detected and often prevented.  Routine colorectal cancer screening usually begins at 62 years of age and continues through 62 years of age.  Your health care provider may recommend screening at an earlier age if you have risk factors for colon cancer.  Your health care provider may also recommend using home test kits to check for hidden blood in the stool.  A small camera at the end of a tube can be used to examine your colon directly (sigmoidoscopy or colonoscopy). This is done to check for the earliest forms of colorectal cancer.  Routine screening usually begins at age 50.  Direct examination of  the colon should be repeated every 5-10 years through 62 years of age. However, you may need to be screened more often if early forms of precancerous polyps or small growths are found.  Skin Cancer  Check your skin from head to toe regularly.  Tell your health care provider about any new moles or changes in moles, especially if there is a change in a mole's shape or color.  Also tell your health care provider if you have a mole that is larger than the size of a pencil eraser.  Always use sunscreen. Apply sunscreen liberally and repeatedly throughout the day.  Protect yourself by wearing long sleeves, pants, a wide-brimmed hat, and sunglasses whenever you are outside.  Heart disease, diabetes, and high blood pressure  High blood pressure causes heart disease and increases the risk of stroke. High blood pressure is more likely to develop in: ? People who have blood pressure in the high end of the normal range (130-139/85-89 mm Hg). ? People who are overweight or obese. ? People who are African American.  If you are 18-39 years of age, have your blood pressure checked every 3-5 years. If you are 40 years of age or older, have your blood pressure checked every year. You should have your blood pressure measured twice-once when you are at a hospital or clinic, and once when you are not at a hospital or clinic. Record the average of the two measurements. To check your blood pressure when you are not at a hospital or clinic, you can use: ? An automated blood pressure machine at a pharmacy. ? A home blood pressure monitor.  If you are between 55 years and 79 years old, ask your health care provider if you should take aspirin to prevent strokes.  Have regular diabetes screenings. This involves taking a blood sample to check your fasting blood sugar level. ? If you are at a normal weight and have a low risk for   diabetes, have this test once every three years after 62 years of age. ? If you are  overweight and have a high risk for diabetes, consider being tested at a younger age or more often. Preventing infection Hepatitis B  If you have a higher risk for hepatitis B, you should be screened for this virus. You are considered at high risk for hepatitis B if: ? You were born in a country where hepatitis B is common. Ask your health care provider which countries are considered high risk. ? Your parents were born in a high-risk country, and you have not been immunized against hepatitis B (hepatitis B vaccine). ? You have HIV or AIDS. ? You use needles to inject street drugs. ? You live with someone who has hepatitis B. ? You have had sex with someone who has hepatitis B. ? You get hemodialysis treatment. ? You take certain medicines for conditions, including cancer, organ transplantation, and autoimmune conditions.  Hepatitis C  Blood testing is recommended for: ? Everyone born from 1945 through 1965. ? Anyone with known risk factors for hepatitis C.  Sexually transmitted infections (STIs)  You should be screened for sexually transmitted infections (STIs) including gonorrhea and chlamydia if: ? You are sexually active and are younger than 62 years of age. ? You are older than 62 years of age and your health care provider tells you that you are at risk for this type of infection. ? Your sexual activity has changed since you were last screened and you are at an increased risk for chlamydia or gonorrhea. Ask your health care provider if you are at risk.  If you do not have HIV, but are at risk, it may be recommended that you take a prescription medicine daily to prevent HIV infection. This is called pre-exposure prophylaxis (PrEP). You are considered at risk if: ? You are sexually active and do not regularly use condoms or know the HIV status of your partner(s). ? You take drugs by injection. ? You are sexually active with a partner who has HIV.  Talk with your health care provider  about whether you are at high risk of being infected with HIV. If you choose to begin PrEP, you should first be tested for HIV. You should then be tested every 3 months for as long as you are taking PrEP. Pregnancy  If you are premenopausal and you may become pregnant, ask your health care provider about preconception counseling.  If you may become pregnant, take 400 to 800 micrograms (mcg) of folic acid every day.  If you want to prevent pregnancy, talk to your health care provider about birth control (contraception). Osteoporosis and menopause  Osteoporosis is a disease in which the bones lose minerals and strength with aging. This can result in serious bone fractures. Your risk for osteoporosis can be identified using a bone density scan.  If you are 65 years of age or older, or if you are at risk for osteoporosis and fractures, ask your health care provider if you should be screened.  Ask your health care provider whether you should take a calcium or vitamin D supplement to lower your risk for osteoporosis.  Menopause may have certain physical symptoms and risks.  Hormone replacement therapy may reduce some of these symptoms and risks. Talk to your health care provider about whether hormone replacement therapy is right for you. Follow these instructions at home:  Schedule regular health, dental, and eye exams.  Stay current with your immunizations.    Do not use any tobacco products including cigarettes, chewing tobacco, or electronic cigarettes.  If you are pregnant, do not drink alcohol.  If you are breastfeeding, limit how much and how often you drink alcohol.  Limit alcohol intake to no more than 1 drink per day for nonpregnant women. One drink equals 12 ounces of beer, 5 ounces of wine, or 1 ounces of hard liquor.  Do not use street drugs.  Do not share needles.  Ask your health care provider for help if you need support or information about quitting drugs.  Tell your  health care provider if you often feel depressed.  Tell your health care provider if you have ever been abused or do not feel safe at home. This information is not intended to replace advice given to you by your health care provider. Make sure you discuss any questions you have with your health care provider. Document Released: 02/02/2011 Document Revised: 12/26/2015 Document Reviewed: 04/23/2015 Elsevier Interactive Patient Education  2018 [employer]  

## 2017-04-28 LAB — COMPREHENSIVE METABOLIC PANEL
AG Ratio: 2 (calc) (ref 1.0–2.5)
ALBUMIN MSPROF: 4 g/dL (ref 3.6–5.1)
ALT: 33 U/L — ABNORMAL HIGH (ref 6–29)
AST: 25 U/L (ref 10–35)
Alkaline phosphatase (APISO): 148 U/L — ABNORMAL HIGH (ref 33–130)
BUN: 25 mg/dL (ref 7–25)
CO2: 28 mmol/L (ref 20–32)
CREATININE: 0.51 mg/dL (ref 0.50–0.99)
Calcium: 9 mg/dL (ref 8.6–10.4)
Chloride: 103 mmol/L (ref 98–110)
Globulin: 2 g/dL (calc) (ref 1.9–3.7)
Glucose, Bld: 90 mg/dL (ref 65–99)
POTASSIUM: 3.9 mmol/L (ref 3.5–5.3)
SODIUM: 141 mmol/L (ref 135–146)
TOTAL PROTEIN: 6 g/dL — AB (ref 6.1–8.1)
Total Bilirubin: 0.4 mg/dL (ref 0.2–1.2)

## 2017-04-28 LAB — LIPID PANEL
CHOL/HDL RATIO: 2.7 (calc) (ref <5.0)
Cholesterol: 147 mg/dL (ref <200)
HDL: 54 mg/dL (ref >50)
LDL CHOLESTEROL (CALC): 75 mg/dL
Non-HDL Cholesterol (Calc): 93 mg/dL (calc) (ref <130)
Triglycerides: 92 mg/dL (ref <150)

## 2017-04-28 LAB — CBC WITH DIFFERENTIAL/PLATELET
Basophils Absolute: 48 cells/uL (ref 0–200)
Basophils Relative: 0.5 %
EOS PCT: 1.3 %
Eosinophils Absolute: 125 cells/uL (ref 15–500)
HCT: 42.2 % (ref 35.0–45.0)
Hemoglobin: 14.2 g/dL (ref 11.7–15.5)
LYMPHS ABS: 2611 {cells}/uL (ref 850–3900)
MCH: 30.3 pg (ref 27.0–33.0)
MCHC: 33.6 g/dL (ref 32.0–36.0)
MCV: 90.2 fL (ref 80.0–100.0)
MPV: 12 fL (ref 7.5–12.5)
Monocytes Relative: 5.3 %
Neutro Abs: 6307 cells/uL (ref 1500–7800)
Neutrophils Relative %: 65.7 %
PLATELETS: 197 10*3/uL (ref 140–400)
RBC: 4.68 10*6/uL (ref 3.80–5.10)
RDW: 11.6 % (ref 11.0–15.0)
TOTAL LYMPHOCYTE: 27.2 %
WBC mixed population: 509 cells/uL (ref 200–950)
WBC: 9.6 10*3/uL (ref 3.8–10.8)

## 2017-04-28 LAB — HEMOGLOBIN A1C
HEMOGLOBIN A1C: 5.2 %{Hb} (ref <5.7)
MEAN PLASMA GLUCOSE: 103 (calc)
eAG (mmol/L): 5.7 (calc)

## 2017-08-19 ENCOUNTER — Telehealth: Payer: Self-pay | Admitting: *Deleted

## 2017-08-19 NOTE — Telephone Encounter (Signed)
Pt called with continued GI issues of mid epigastric pain in middle of night and pain after just watered down coffee and light peppered food.  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 Was previously advised will have to go to [facility] for this.  Pt was advised to eliminate coffee, any acidic foods and stick with a bland diet until seen. Continue omeprazole as prescribed in Sept 2018.  Pt will call bariatric surgeon for referral to GI at [facility].  KW CMA

## 2017-08-21 ENCOUNTER — Other Ambulatory Visit: Payer: Self-pay | Admitting: Women's Health

## 2017-08-23 ENCOUNTER — Other Ambulatory Visit: Payer: Self-pay

## 2017-08-23 NOTE — Telephone Encounter (Signed)
Agree with note written by Benita Gutter in regards to follow-up with GI.

## 2017-08-23 NOTE — Telephone Encounter (Signed)
Okay for refill, I think she lives in [place] now, make sure she is seen primary care there.

## 2017-11-23 ENCOUNTER — Other Ambulatory Visit: Payer: Self-pay | Admitting: Women's Health

## 2017-12-08 ENCOUNTER — Other Ambulatory Visit: Payer: Self-pay | Admitting: Women's Health

## 2017-12-08 DIAGNOSIS — F4323 Adjustment disorder with mixed anxiety and depressed mood: Secondary | ICD-10-CM

## 2018-03-04 ENCOUNTER — Other Ambulatory Visit: Payer: Self-pay | Admitting: Women's Health

## 2018-03-08 NOTE — Telephone Encounter (Signed)
Ok for refill? 

## 2018-05-30 ENCOUNTER — Encounter: Payer: BLUE CROSS/BLUE SHIELD | Admitting: Women's Health

## 2018-06-01 ENCOUNTER — Other Ambulatory Visit: Payer: Self-pay | Admitting: Women's Health

## 2018-06-01 ENCOUNTER — Encounter: Payer: Self-pay | Admitting: Women's Health

## 2018-06-01 ENCOUNTER — Ambulatory Visit (INDEPENDENT_AMBULATORY_CARE_PROVIDER_SITE_OTHER): Payer: BLUE CROSS/BLUE SHIELD | Admitting: Women's Health

## 2018-06-01 VITALS — BP 122/78 | Ht 65.0 in | Wt 126.0 lb

## 2018-06-01 DIAGNOSIS — F32A Depression, unspecified: Secondary | ICD-10-CM

## 2018-06-01 DIAGNOSIS — Z01419 Encounter for gynecological examination (general) (routine) without abnormal findings: Secondary | ICD-10-CM | POA: Diagnosis not present

## 2018-06-01 DIAGNOSIS — F329 Major depressive disorder, single episode, unspecified: Secondary | ICD-10-CM

## 2018-06-01 DIAGNOSIS — E559 Vitamin D deficiency, unspecified: Secondary | ICD-10-CM | POA: Diagnosis not present

## 2018-06-01 DIAGNOSIS — F4323 Adjustment disorder with mixed anxiety and depressed mood: Secondary | ICD-10-CM | POA: Diagnosis not present

## 2018-06-01 DIAGNOSIS — Z1231 Encounter for screening mammogram for malignant neoplasm of breast: Secondary | ICD-10-CM

## 2018-06-01 MED ORDER — BUPROPION HCL 75 MG PO TABS: ORAL_TABLET | ORAL | 4 refills | Status: DC

## 2018-06-01 MED ORDER — SERTRALINE HCL 50 MG PO TABS: ORAL_TABLET | ORAL | 4 refills | Status: DC

## 2018-06-01 MED ORDER — ESTRADIOL 10 MCG VA TABS: ORAL_TABLET | VAGINAL | 11 refills | Status: DC

## 2018-06-01 NOTE — Patient Instructions (Addendum)
Health Maintenance for Postmenopausal Women Menopause is a normal process in which your reproductive ability comes to an end. This process happens gradually over a span of months to years, usually between the ages of 22 and 9. Menopause is complete when you have missed 12 consecutive menstrual periods. It is important to talk with your health care provider about some of the most common conditions that affect postmenopausal women, such as heart disease, cancer, and bone loss (osteoporosis). Adopting a healthy lifestyle and getting preventive care can help to promote your health and wellness. Those actions can also lower your chances of developing some of these common conditions. What should I know about menopause? During menopause, you may experience a number of symptoms, such as:  Moderate-to-severe hot flashes.  Night sweats.  Decrease in sex drive.  Mood swings.  Headaches.  Tiredness.  Irritability.  Memory problems.  Insomnia.  Choosing to treat or not to treat menopausal changes is an individual decision that you make with your health care provider. What should I know about hormone replacement therapy and supplements? Hormone therapy products are effective for treating symptoms that are associated with menopause, such as hot flashes and night sweats. Hormone replacement carries certain risks, especially as you become older. If you are thinking about using estrogen or estrogen with progestin treatments, discuss the benefits and risks with your health care provider. What should I know about heart disease and stroke? Heart disease, heart attack, and stroke become more likely as you age. This may be due, in part, to the hormonal changes that your body experiences during menopause. These can affect how your body processes dietary fats, triglycerides, and cholesterol. Heart attack and stroke are both medical emergencies. There are many things that you can do to help prevent heart disease  and stroke:  Have your blood pressure checked at least every 1-2 years. High blood pressure causes heart disease and increases the risk of stroke.  If you are 53-22 years old, ask your health care provider if you should take aspirin to prevent a heart attack or a stroke.  Do not use any tobacco products, including cigarettes, chewing tobacco, or electronic cigarettes. If you need help quitting, ask your health care provider.  It is important to eat a healthy diet and maintain a healthy weight. ? Be sure to include plenty of vegetables, fruits, low-fat dairy products, and lean protein. ? Avoid eating foods that are high in solid fats, added sugars, or salt (sodium).  Get regular exercise. This is one of the most important things that you can do for your health. ? Try to exercise for at least 150 minutes each week. The type of exercise that you do should increase your heart rate and make you sweat. This is known as moderate-intensity exercise. ? Try to do strengthening exercises at least twice each week. Do these in addition to the moderate-intensity exercise.  Know your numbers.Ask your health care provider to check your cholesterol and your blood glucose. Continue to have your blood tested as directed by your health care provider.  What should I know about cancer screening? There are several types of cancer. Take the following steps to reduce your risk and to catch any cancer development as early as possible. Breast Cancer  Practice breast self-awareness. ? This means understanding how your breasts normally appear and feel. ? It also means doing regular breast self-exams. Let your health care provider know about any changes, no matter how small.  If you are 40  or older, have a clinician do a breast exam (clinical breast exam or CBE) every year. Depending on your age, family history, and medical history, it may be recommended that you also have a yearly breast X-ray (mammogram).  If you  have a family history of breast cancer, talk with your health care provider about genetic screening.  If you are at high risk for breast cancer, talk with your health care provider about having an MRI and a mammogram every year.  Breast cancer (BRCA) gene test is recommended for women who have family members with BRCA-related cancers. Results of the assessment will determine the need for genetic counseling and BRCA1 and for BRCA2 testing. BRCA-related cancers include these types: ? Breast. This occurs in males or females. ? Ovarian. ? Tubal. This may also be called fallopian tube cancer. ? Cancer of the abdominal or pelvic lining (peritoneal cancer). ? Prostate. ? Pancreatic.  Cervical, Uterine, and Ovarian Cancer Your health care provider may recommend that you be screened regularly for cancer of the pelvic organs. These include your ovaries, uterus, and vagina. This screening involves a pelvic exam, which includes checking for microscopic changes to the surface of your cervix (Pap test).  For women ages 21-65, health care providers may recommend a pelvic exam and a Pap test every three years. For women ages 79-65, they may recommend the Pap test and pelvic exam, combined with testing for human papilloma virus (HPV), every five years. Some types of HPV increase your risk of cervical cancer. Testing for HPV may also be done on women of any age who have unclear Pap test results.  Other health care providers may not recommend any screening for nonpregnant women who are considered low risk for pelvic cancer and have no symptoms. Ask your health care provider if a screening pelvic exam is right for you.  If you have had past treatment for cervical cancer or a condition that could lead to cancer, you need Pap tests and screening for cancer for at least 20 years after your treatment. If Pap tests have been discontinued for you, your risk factors (such as having a new sexual partner) need to be  reassessed to determine if you should start having screenings again. Some women have medical problems that increase the chance of getting cervical cancer. In these cases, your health care provider may recommend that you have screening and Pap tests more often.  If you have a family history of uterine cancer or ovarian cancer, talk with your health care provider about genetic screening.  If you have vaginal bleeding after reaching menopause, tell your health care provider.  There are currently no reliable tests available to screen for ovarian cancer.  Lung Cancer Lung cancer screening is recommended for adults 69-62 years old who are at high risk for lung cancer because of a history of smoking. A yearly low-dose CT scan of the lungs is recommended if you:  Currently smoke.  Have a history of at least 30 pack-years of smoking and you currently smoke or have quit within the past 15 years. A pack-year is smoking an average of one pack of cigarettes per day for one year.  Yearly screening should:  Continue until it has been 15 years since you quit.  Stop if you develop a health problem that would prevent you from having lung cancer treatment.  Colorectal Cancer  This type of cancer can be detected and can often be prevented.  Routine colorectal cancer screening usually begins at  age 42 and continues through age 45.  If you have risk factors for colon cancer, your health care provider may recommend that you be screened at an earlier age.  If you have a family history of colorectal cancer, talk with your health care provider about genetic screening.  Your health care provider may also recommend using home test kits to check for hidden blood in your stool.  A small camera at the end of a tube can be used to examine your colon directly (sigmoidoscopy or colonoscopy). This is done to check for the earliest forms of colorectal cancer.  Direct examination of the colon should be repeated every  5-10 years until age 71. However, if early forms of precancerous polyps or small growths are found or if you have a family history or genetic risk for colorectal cancer, you may need to be screened more often.  Skin Cancer  Check your skin from head to toe regularly.  Monitor any moles. Be sure to tell your health care provider: ? About any new moles or changes in moles, especially if there is a change in a mole's shape or color. ? If you have a mole that is larger than the size of a pencil eraser.  If any of your family members has a history of skin cancer, especially at a young age, talk with your health care provider about genetic screening.  Always use sunscreen. Apply sunscreen liberally and repeatedly throughout the day.  Whenever you are outside, protect yourself by wearing long sleeves, pants, a wide-brimmed hat, and sunglasses.  What should I know about osteoporosis? Osteoporosis is a condition in which bone destruction happens more quickly than new bone creation. After menopause, you may be at an increased risk for osteoporosis. To help prevent osteoporosis or the bone fractures that can happen because of osteoporosis, the following is recommended:  If you are 46-71 years old, get at least 1,000 mg of calcium and at least 600 mg of vitamin D per day.  If you are older than age 55 but younger than age 65, get at least 1,200 mg of calcium and at least 600 mg of vitamin D per day.  If you are older than age 54, get at least 1,200 mg of calcium and at least 800 mg of vitamin D per day.  Smoking and excessive alcohol intake increase the risk of osteoporosis. Eat foods that are rich in calcium and vitamin D, and do weight-bearing exercises several times each week as directed by your health care provider. What should I know about how menopause affects my mental health? Depression may occur at any age, but it is more common as you become older. Common symptoms of depression  include:  Low or sad mood.  Changes in sleep patterns.  Changes in appetite or eating patterns.  Feeling an overall lack of motivation or enjoyment of activities that you previously enjoyed.  Frequent crying spells.  Talk with your health care provider if you think that you are experiencing depression. What should I know about immunizations? It is important that you get and maintain your immunizations. These include:  Tetanus, diphtheria, and pertussis (Tdap) booster vaccine.  Influenza every year before the flu season begins.  Pneumonia vaccine.  Shingles vaccine.  Your health care provider may also recommend other immunizations. This information is not intended to replace advice given to you by your health care provider. Make sure you discuss any questions you have with your health care provider. Document Released: 09/11/2005  Document Revised: 02/07/2016 Document Reviewed: 04/23/2015 Elsevier Interactive Patient Education  2018 [place] and Stress Management Stress is a normal reaction to life events. It is what you feel when life demands more than you are used to or more than you can handle. Some stress can be useful. For example, the stress reaction can help you catch the last bus of the day, study for a test, or meet a deadline at work. But stress that occurs too often or for too long can cause problems. It can affect your emotional health and interfere with relationships and normal daily activities. Too much stress can weaken your immune system and increase your risk for physical illness. If you already have a medical problem, stress can make it worse. What are the causes? All sorts of life events may cause stress. An event that causes stress for one person may not be stressful for another person. Major life events commonly cause stress. These may be positive or negative. Examples include losing your job, moving into a new home, getting married, having a baby, or  losing a loved one. Less obvious life events may also cause stress, especially if they occur day after day or in combination. Examples include working long hours, driving in traffic, caring for children, being in debt, or being in a difficult relationship. What are the signs or symptoms? Stress may cause emotional symptoms including, the following:  Anxiety. This is feeling worried, afraid, on edge, overwhelmed, or out of control.  Anger. This is feeling irritated or impatient.  Depression. This is feeling sad, down, helpless, or guilty.  Difficulty focusing, remembering, or making decisions.  Stress may cause physical symptoms, including the following:  Aches and pains. These may affect your head, neck, back, stomach, or other areas of your body.  Tight muscles or clenched jaw.  Low energy or trouble sleeping.  Stress may cause unhealthy behaviors, including the following:  Eating to feel better (overeating) or skipping meals.  Sleeping too little, too much, or both.  Working too much or putting off tasks (procrastination).  Smoking, drinking alcohol, or using drugs to feel better.  How is this diagnosed? Stress is diagnosed through an assessment by your health care provider. Your health care provider will ask questions about your symptoms and any stressful life events.Your health care provider will also ask about your medical history and may order blood tests or other tests. Certain medical conditions and medicine can cause physical symptoms similar to stress. Mental illness can cause emotional symptoms and unhealthy behaviors similar to stress. Your health care provider may refer you to a mental health professional for further evaluation. How is this treated? Stress management is the recommended treatment for stress.The goals of stress management are reducing stressful life events and coping with stress in healthy ways. Techniques for reducing stressful life events include the  following:  Stress identification. Self-monitor for stress and identify what causes stress for you. These skills may help you to avoid some stressful events.  Time management. Set your priorities, keep a calendar of events, and learn to say "no." These tools can help you avoid making too many commitments.  Techniques for coping with stress include the following:  Rethinking the problem. Try to think realistically about stressful events rather than ignoring them or overreacting. Try to find the positives in a stressful situation rather than focusing on the negatives.  Exercise. Physical exercise can release both physical and emotional tension. The key is to find a form  of exercise you enjoy and do it regularly.  Relaxation techniques. These relax the body and mind. Examples include yoga, meditation, tai chi, biofeedback, deep breathing, progressive muscle relaxation, listening to music, being out in nature, journaling, and other hobbies. Again, the key is to find one or more that you enjoy and can do regularly.  Healthy lifestyle. Eat a balanced diet, get plenty of sleep, and do not smoke. Avoid using alcohol or drugs to relax.  Strong support network. Spend time with family, friends, or other people you enjoy being around.Express your feelings and talk things over with someone you trust.  Counseling or talktherapy with a mental health professional may be helpful if you are having difficulty managing stress on your own. Medicine is typically not recommended for the treatment of stress.Talk to your health care provider if you think you need medicine for symptoms of stress. Follow these instructions at home:  Keep all follow-up visits as directed by your health care provider.  Take all medicines as directed by your health care provider. Contact a health care provider if:  Your symptoms get worse or you start having new symptoms.  You feel overwhelmed by your problems and can no longer  manage them on your own. Get help right away if:  You feel like hurting yourself or someone else. This information is not intended to replace advice given to you by your health care provider. Make sure you discuss any questions you have with your health care provider. Document Released: 01/13/2001 Document Revised: 12/26/2015 Document Reviewed: 03/14/2013 Elsevier Interactive Patient Education  2017 [employer].

## 2018-06-01 NOTE — Progress Notes (Signed)
Heidi Pugh 1955-12-23 [id number]    History:    Presents for annual exam.  1995 TVH with BSO for endometriosis and DU B on no HRT.  Normal Pap and mammogram history.  Last mammogram 2016.  2016 T score -1.6 at hip.  History of gastric bypass with sustained weight loss greater than 100 pounds.  Not sexually active due to dryness.  Past medical history, past surgical history, family history and social history were all reviewed and documented in the EPIC chart.  lives part of the year here,  [place] and [place].  ROS:  A ROS was performed and pertinent positives and negatives are included.  Exam:  Vitals:   06/01/18 1230  BP: 122/78  Weight: 126 lb (57.2 kg)  Height: 5\' 5"  (1.651 m)   Body mass index is 20.97 kg/m.   General appearance:  Normal Thyroid:  Symmetrical, normal in size, without palpable masses or nodularity. Respiratory  Auscultation:  Clear without wheezing or rhonchi Cardiovascular  Auscultation:  Regular rate, without rubs, murmurs or gallops  Edema/varicosities:  Not grossly evident Abdominal  Soft,nontender, without masses, guarding or rebound.  Liver/spleen:  No organomegaly noted  Hernia:  None appreciated  Skin  Inspection:  Grossly normal   Breasts: Examined lying and sitting.     Right: Without masses, retractions, discharge or axillary adenopathy.     Left: Without masses, retractions, discharge or axillary adenopathy. Gentitourinary   Inguinal/mons:  Normal without inguinal adenopathy  External genitalia:  Normal  BUS/Urethra/Skene's glands:  Normal  Vagina: Atrophic  Cervix: And uterus absent  adnexa/parametria:     Rt: Without masses or tenderness.   Lt: Without masses or tenderness.  Anus and perineum: Normal  Digital rectal exam: Normal sphincter tone without palpated masses or tenderness  Assessment/Plan:  62 y.o. M WF G2, P1 for annual exam.     2005 TVH with BSO for endometriosis and DU B on no HRT with vaginal atrophy Osteopenia  without elevated FRAX 05/2014 gastric sleeve with sustained weight loss Anxiety and depression stable on Wellbutrin and Zoloft has had therapy.  Plan: Extreme vaginal atrophy, options reviewed we will try Vagifem 1 applicator at bedtime x2 weeks and then twice weekly thereafter.  Prescription, proper use, reviewed minimal systemic absorption.  Instructed to call if continued problems with dryness.  SBE's, keep scheduled mammogram appointment this week.  Bone density instructed to schedule.  Reviewed importance of home safety, fall prevention, weightbearing and balance type exercise.  Zoloft 50 mg p.o. daily, Wellbutrin 75 mg p.o. daily prescriptions for both given, therapy as needed, self-care and leisure activities, continue painting on a regular basis.  CBC, CMP, vitamin D, ( normal/low lipid panel 2018).      [place], 12:41 PM 06/01/2018

## 2018-06-02 ENCOUNTER — Other Ambulatory Visit: Payer: Self-pay | Admitting: Women's Health

## 2018-06-02 LAB — CBC WITH DIFFERENTIAL/PLATELET
BASOS ABS: 26 {cells}/uL (ref 0–200)
Basophils Relative: 0.3 %
EOS PCT: 1.8 %
Eosinophils Absolute: 158 cells/uL (ref 15–500)
HCT: 42.4 % (ref 35.0–45.0)
HEMOGLOBIN: 14.3 g/dL (ref 11.7–15.5)
LYMPHS ABS: 2693 {cells}/uL (ref 850–3900)
MCH: 30.6 pg (ref 27.0–33.0)
MCHC: 33.7 g/dL (ref 32.0–36.0)
MCV: 90.8 fL (ref 80.0–100.0)
MONOS PCT: 5.3 %
MPV: 12.2 fL (ref 7.5–12.5)
NEUTROS PCT: 62 %
Neutro Abs: 5456 cells/uL (ref 1500–7800)
Platelets: 187 10*3/uL (ref 140–400)
RBC: 4.67 10*6/uL (ref 3.80–5.10)
RDW: 11.7 % (ref 11.0–15.0)
TOTAL LYMPHOCYTE: 30.6 %
WBC mixed population: 466 cells/uL (ref 200–950)
WBC: 8.8 10*3/uL (ref 3.8–10.8)

## 2018-06-02 LAB — COMPREHENSIVE METABOLIC PANEL
AG RATIO: 2.3 (calc) (ref 1.0–2.5)
ALBUMIN MSPROF: 4.3 g/dL (ref 3.6–5.1)
ALKALINE PHOSPHATASE (APISO): 144 U/L — AB (ref 33–130)
ALT: 29 U/L (ref 6–29)
AST: 26 U/L (ref 10–35)
BUN: 17 mg/dL (ref 7–25)
CHLORIDE: 107 mmol/L (ref 98–110)
CO2: 25 mmol/L (ref 20–32)
Calcium: 9.2 mg/dL (ref 8.6–10.4)
Creat: 0.57 mg/dL (ref 0.50–0.99)
GLOBULIN: 1.9 g/dL (ref 1.9–3.7)
Glucose, Bld: 94 mg/dL (ref 65–99)
POTASSIUM: 4.1 mmol/L (ref 3.5–5.3)
Sodium: 140 mmol/L (ref 135–146)
Total Bilirubin: 0.3 mg/dL (ref 0.2–1.2)
Total Protein: 6.2 g/dL (ref 6.1–8.1)

## 2018-06-02 LAB — VITAMIN D 25 HYDROXY (VIT D DEFICIENCY, FRACTURES): Vit D, 25-Hydroxy: 24 ng/mL — ABNORMAL LOW (ref 30–100)

## 2018-06-03 ENCOUNTER — Ambulatory Visit
Admission: RE | Admit: 2018-06-03 | Discharge: 2018-06-03 | Disposition: A | Discharge status: Home / Self Care | Payer: BLUE CROSS/BLUE SHIELD | Source: Ambulatory Visit | Attending: Women's Health | Admitting: Women's Health

## 2018-06-03 DIAGNOSIS — Z1231 Encounter for screening mammogram for malignant neoplasm of breast: Secondary | ICD-10-CM

## 2018-09-23 ENCOUNTER — Telehealth: Payer: Self-pay | Admitting: *Deleted

## 2018-09-23 DIAGNOSIS — F4323 Adjustment disorder with mixed anxiety and depressed mood: Secondary | ICD-10-CM

## 2018-09-23 MED ORDER — SERTRALINE HCL 50 MG PO TABS: ORAL_TABLET | ORAL | 4 refills | Status: DC

## 2018-09-23 MED ORDER — OMEPRAZOLE 20 MG PO CPDR: DELAYED_RELEASE_CAPSULE | ORAL | 3 refills | Status: DC

## 2018-09-23 NOTE — Telephone Encounter (Signed)
Pt called requesting refills. Pt current on annual exam. KW CMA

## 2018-12-24 ENCOUNTER — Other Ambulatory Visit: Payer: Self-pay | Admitting: Women's Health

## 2018-12-24 DIAGNOSIS — F4323 Adjustment disorder with mixed anxiety and depressed mood: Secondary | ICD-10-CM

## 2018-12-27 NOTE — Telephone Encounter (Signed)
Okay for refills.

## 2019-03-28 ENCOUNTER — Other Ambulatory Visit: Payer: Self-pay | Admitting: *Deleted

## 2019-03-28 DIAGNOSIS — M8580 Other specified disorders of bone density and structure, unspecified site: Secondary | ICD-10-CM

## 2019-04-04 ENCOUNTER — Other Ambulatory Visit: Payer: Self-pay | Admitting: Women's Health

## 2019-04-04 DIAGNOSIS — Z1231 Encounter for screening mammogram for malignant neoplasm of breast: Secondary | ICD-10-CM

## 2019-04-07 ENCOUNTER — Telehealth: Payer: Self-pay | Admitting: Gastroenterology

## 2019-04-07 NOTE — Telephone Encounter (Signed)
Pt last seen in 2016 with Jessica.  She is due for a recall colon--requested Dr. Hilarie Fredrickson.  Pt inquired whether she can have colon with hx of gastric bypass "and two other surgeries in one."  Please advise.

## 2019-04-11 NOTE — Telephone Encounter (Signed)
The pt has been scheduled to see Dr Silverio Decamp to discuss her concerns with colon due to history of gastric bypass.  She did wish to have appointment with Dr Silverio Decamp.

## 2019-04-19 ENCOUNTER — Encounter: Payer: Self-pay | Admitting: Gastroenterology

## 2019-05-02 ENCOUNTER — Encounter: Payer: Self-pay | Admitting: Gynecology

## 2019-05-09 ENCOUNTER — Encounter: Payer: BLUE CROSS/BLUE SHIELD | Admitting: Women's Health

## 2019-05-22 ENCOUNTER — Ambulatory Visit: Payer: BLUE CROSS/BLUE SHIELD | Admitting: Gastroenterology

## 2019-06-05 ENCOUNTER — Ambulatory Visit: Payer: BLUE CROSS/BLUE SHIELD

## 2019-06-05 ENCOUNTER — Encounter: Payer: Self-pay | Admitting: Women's Health

## 2019-06-05 ENCOUNTER — Other Ambulatory Visit: Payer: Self-pay

## 2019-06-05 ENCOUNTER — Ambulatory Visit: Payer: BLUE CROSS/BLUE SHIELD | Admitting: Women's Health

## 2019-06-05 DIAGNOSIS — F329 Major depressive disorder, single episode, unspecified: Secondary | ICD-10-CM | POA: Diagnosis not present

## 2019-06-05 DIAGNOSIS — F32A Depression, unspecified: Secondary | ICD-10-CM

## 2019-06-05 DIAGNOSIS — F4323 Adjustment disorder with mixed anxiety and depressed mood: Secondary | ICD-10-CM | POA: Diagnosis not present

## 2019-06-05 MED ORDER — SERTRALINE HCL 50 MG PO TABS: ORAL_TABLET | ORAL | 4 refills | Status: DC

## 2019-06-05 MED ORDER — AMPICILLIN 125 MG/5ML PO SUSR: 125.0000 mg | Freq: Four times a day (QID) | ORAL | 0 refills | Status: DC

## 2019-06-05 MED ORDER — BUPROPION HCL 75 MG PO TABS: ORAL_TABLET | ORAL | 4 refills | Status: DC

## 2019-06-05 MED ORDER — OMEPRAZOLE 20 MG PO CPDR
DELAYED_RELEASE_CAPSULE | ORAL | 4 refills | Status: DC
Start: 2019-06-05 — End: 2020-06-17

## 2019-06-05 NOTE — Progress Notes (Signed)
63 year old MWF G2 P1 +2 stepchildren presents to discuss medication refill.  Lives part of the year here and part of year in [place].  1995 TVH with BSO for endometriosis and DUB on no HRT.  Normal Pap and mammogram history.  Mammogram due.  History of gastric bypass with sustained weight loss greater than 100 pounds.  Not sexually active husband's health.  Currently on Zoloft 50 mg and Wellbutrin 50 mg (has decreased dosage from 100 mg daily)  with good relief of anxiety/depression symptoms and would like to continue.  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  Denies need for counseling at this time.  Also on Prilosec with good relief of reflux and has also decreased dose.  Denies vaginal symptoms of discharge, itching or odor, urinary symptoms, abdominal/back pain or fever.  Exam: Appears well.  Lungs clear throughout, heart regular rate and rhythm, breast examined sitting and lying position without retractions, erythema, palpable nodules.   Anxiety/depression stable on Zoloft and Wellbutrin TVH with BSO on no HRT Reflux stable on Prilosec  Plan: Refills of Zoloft 50 mg p.o. daily, Wellbutrin 50 mg, has reduced dose and tolerating well.  Reviewed importance of continuing self-care, leisure activities of painting, crafts, teaching cooking and craft classes.  Continue active lifestyle of regular walking.  Encouraged yoga/balance type exercise classes.  Schedule mammogram.  Counseling as needed.  Prilosec 20 mg p.o. daily had been on twice daily has reduced to once daily.  Congratulated on sustained weight loss, weight today 131.  Annual exam with primary care for routine labs.

## 2019-06-05 NOTE — Patient Instructions (Signed)
Take a YOGA class  Keep walking daily  Thank you so much for the beautiful coffee mugs!!!!  Health Maintenance for Postmenopausal Women Menopause is a normal process in which your ability to get pregnant comes to an end. This process happens slowly over many months or years, usually between the ages of 27 and 68. Menopause is complete when you have missed your menstrual periods for 12 months. It is important to talk with your health care provider about some of the most common conditions that affect women after menopause (postmenopausal women). These include heart disease, cancer, and bone loss (osteoporosis). Adopting a healthy lifestyle and getting preventive care can help to promote your health and wellness. The actions you take can also lower your chances of developing some of these common conditions. What should I know about menopause? During menopause, you may get a number of symptoms, such as:  Hot flashes. These can be moderate or severe.  Night sweats.  Decrease in sex drive.  Mood swings.  Headaches.  Tiredness.  Irritability.  Memory problems.  Insomnia. Choosing to treat or not to treat these symptoms is a decision that you make with your health care provider. Do I need hormone replacement therapy?  Hormone replacement therapy is effective in treating symptoms that are caused by menopause, such as hot flashes and night sweats.  Hormone replacement carries certain risks, especially as you become older. If you are thinking about using estrogen or estrogen with progestin, discuss the benefits and risks with your health care provider. What is my risk for heart disease and stroke? The risk of heart disease, heart attack, and stroke increases as you age. One of the causes may be a change in the body's hormones during menopause. This can affect how your body uses dietary fats, triglycerides, and cholesterol. Heart attack and stroke are medical emergencies. There are many  things that you can do to help prevent heart disease and stroke. Watch your blood pressure  High blood pressure causes heart disease and increases the risk of stroke. This is more likely to develop in people who have high blood pressure readings, are of African descent, or are overweight.  Have your blood pressure checked: ? Every 3-5 years if you are 46-58 years of age. ? Every year if you are 58 years old or older. Eat a healthy diet   Eat a diet that includes plenty of vegetables, fruits, low-fat dairy products, and lean protein.  Do not eat a lot of foods that are high in solid fats, added sugars, or sodium. Get regular exercise Get regular exercise. This is one of the most important things you can do for your health. Most adults should:  Try to exercise for at least 150 minutes each week. The exercise should increase your heart rate and make you sweat (moderate-intensity exercise).  Try to do strengthening exercises at least twice each week. Do these in addition to the moderate-intensity exercise.  Spend less time sitting. Even light physical activity can be beneficial. Other tips  Work with your health care provider to achieve or maintain a healthy weight.  Do not use any products that contain nicotine or tobacco, such as cigarettes, e-cigarettes, and chewing tobacco. If you need help quitting, ask your health care provider.  Know your numbers. Ask your health care provider to check your cholesterol and your blood sugar (glucose). Continue to have your blood tested as directed by your health care provider. Do I need screening for cancer? Depending on your  health history and family history, you may need to have cancer screening at different stages of your life. This may include screening for:  Breast cancer.  Cervical cancer.  Lung cancer.  Colorectal cancer. What is my risk for osteoporosis? After menopause, you may be at increased risk for osteoporosis. Osteoporosis is  a condition in which bone destruction happens more quickly than new bone creation. To help prevent osteoporosis or the bone fractures that can happen because of osteoporosis, you may take the following actions:  If you are 57-31 years old, get at least 1,000 mg of calcium and at least 600 mg of vitamin D per day.  If you are older than age 68 but younger than age 30, get at least 1,200 mg of calcium and at least 600 mg of vitamin D per day.  If you are older than age 27, get at least 1,200 mg of calcium and at least 800 mg of vitamin D per day. Smoking and drinking excessive alcohol increase the risk of osteoporosis. Eat foods that are rich in calcium and vitamin D, and do weight-bearing exercises several times each week as directed by your health care provider. How does menopause affect my mental health? Depression may occur at any age, but it is more common as you become older. Common symptoms of depression include:  Low or sad mood.  Changes in sleep patterns.  Changes in appetite or eating patterns.  Feeling an overall lack of motivation or enjoyment of activities that you previously enjoyed.  Frequent crying spells. Talk with your health care provider if you think that you are experiencing depression. General instructions See your health care provider for regular wellness exams and vaccines. This may include:  Scheduling regular health, dental, and eye exams.  Getting and maintaining your vaccines. These include: ? Influenza vaccine. Get this vaccine each year before the flu season begins. ? Pneumonia vaccine. ? Shingles vaccine. ? Tetanus, diphtheria, and pertussis (Tdap) booster vaccine. Your health care provider may also recommend other immunizations. Tell your health care provider if you have ever been abused or do not feel safe at home. Summary  Menopause is a normal process in which your ability to get pregnant comes to an end.  This condition causes hot flashes, night  sweats, decreased interest in sex, mood swings, headaches, or lack of sleep.  Treatment for this condition may include hormone replacement therapy.  Take actions to keep yourself healthy, including exercising regularly, eating a healthy diet, watching your weight, and checking your blood pressure and blood sugar levels.  Get screened for cancer and depression. Make sure that you are up to date with all your vaccines. This information is not intended to replace advice given to you by your health care provider. Make sure you discuss any questions you have with your health care provider. Document Released: 09/11/2005 Document Revised: 07/13/2018 Document Reviewed: 07/13/2018 Elsevier Patient Education  2020 [employer].

## 2019-06-06 ENCOUNTER — Telehealth: Payer: Self-pay

## 2019-06-06 NOTE — Telephone Encounter (Signed)
Left message on pharmacy voice mail that Rx no longer needed to cancel and if anything else we will let them know.

## 2019-06-06 NOTE — Telephone Encounter (Signed)
Telephone call-left message on Heidi Pugh's cell phone liquid ampicillin not available.  She was prescribed ampicillin for her cat per the Vet,  unable to get the capsules down the cat and requested liquid ampicillin.  The Vet had originally prescribed ampicillin and they had no liquid at the vets office so they gave her the capsules.

## 2019-06-06 NOTE — Telephone Encounter (Signed)
Pharmacist said you prescribed liquid Ampicillin yesterday and it is no longer available from any source. Capsules available in 250 mg and 500 mg.

## 2019-06-19 ENCOUNTER — Other Ambulatory Visit: Payer: Self-pay | Admitting: Women's Health

## 2019-06-19 DIAGNOSIS — F4323 Adjustment disorder with mixed anxiety and depressed mood: Secondary | ICD-10-CM

## 2019-10-30 ENCOUNTER — Other Ambulatory Visit: Payer: Self-pay | Admitting: *Deleted

## 2019-10-30 DIAGNOSIS — F4323 Adjustment disorder with mixed anxiety and depressed mood: Secondary | ICD-10-CM

## 2019-10-30 MED ORDER — SERTRALINE HCL 50 MG PO TABS: ORAL_TABLET | ORAL | 4 refills | Status: DC

## 2019-10-30 NOTE — Telephone Encounter (Signed)
Pt requested Refill on Sertaline be sent to [facility] in [place].  Refill sent. KW CMA

## 2020-05-03 ENCOUNTER — Other Ambulatory Visit: Payer: Self-pay | Admitting: Nurse Practitioner

## 2020-05-03 ENCOUNTER — Other Ambulatory Visit: Payer: Self-pay | Admitting: Women's Health

## 2020-05-03 DIAGNOSIS — Z1231 Encounter for screening mammogram for malignant neoplasm of breast: Secondary | ICD-10-CM

## 2020-06-03 ENCOUNTER — Other Ambulatory Visit: Payer: Self-pay

## 2020-06-03 ENCOUNTER — Ambulatory Visit
Admission: RE | Admit: 2020-06-03 | Discharge: 2020-06-03 | Disposition: A | Discharge status: Home / Self Care | Payer: PRIVATE HEALTH INSURANCE | Source: Ambulatory Visit | Attending: Nurse Practitioner | Admitting: Nurse Practitioner

## 2020-06-03 DIAGNOSIS — Z1231 Encounter for screening mammogram for malignant neoplasm of breast: Secondary | ICD-10-CM

## 2020-06-05 ENCOUNTER — Ambulatory Visit (INDEPENDENT_AMBULATORY_CARE_PROVIDER_SITE_OTHER): Payer: PRIVATE HEALTH INSURANCE | Admitting: Nurse Practitioner

## 2020-06-05 ENCOUNTER — Other Ambulatory Visit: Payer: Self-pay

## 2020-06-05 ENCOUNTER — Encounter (INDEPENDENT_AMBULATORY_CARE_PROVIDER_SITE_OTHER): Payer: PRIVATE HEALTH INSURANCE

## 2020-06-05 ENCOUNTER — Encounter: Payer: Self-pay | Admitting: Nurse Practitioner

## 2020-06-05 VITALS — BP 116/74 | Ht 65.0 in | Wt 132.0 lb

## 2020-06-05 DIAGNOSIS — F32A Depression, unspecified: Secondary | ICD-10-CM

## 2020-06-05 DIAGNOSIS — Z01419 Encounter for gynecological examination (general) (routine) without abnormal findings: Secondary | ICD-10-CM

## 2020-06-05 DIAGNOSIS — Z780 Asymptomatic menopausal state: Secondary | ICD-10-CM

## 2020-06-05 DIAGNOSIS — M85851 Other specified disorders of bone density and structure, right thigh: Secondary | ICD-10-CM

## 2020-06-05 DIAGNOSIS — N951 Menopausal and female climacteric states: Secondary | ICD-10-CM

## 2020-06-05 DIAGNOSIS — Z90710 Acquired absence of both cervix and uterus: Secondary | ICD-10-CM | POA: Diagnosis not present

## 2020-06-05 DIAGNOSIS — E559 Vitamin D deficiency, unspecified: Secondary | ICD-10-CM

## 2020-06-05 LAB — CBC WITH DIFFERENTIAL/PLATELET
Lymphs Abs: 2465 cells/uL (ref 850–3900)
MCV: 81.2 fL (ref 80.0–100.0)
RBC: 4.74 10*6/uL (ref 3.80–5.10)
WBC: 8.9 10*3/uL (ref 3.8–10.8)

## 2020-06-05 NOTE — Addendum Note (Signed)
Addended by: Lorine Bears on: 06/05/2020 11:45 AM   Modules accepted: Orders

## 2020-06-05 NOTE — Patient Instructions (Addendum)
Schedule colonoscopy!  Health Maintenance for Postmenopausal Women Menopause is a normal process in which your ability to get pregnant comes to an end. This process happens slowly over many months or years, usually between the ages of 63 and 62. Menopause is complete when you have missed your menstrual periods for 12 months. It is important to talk with your health care provider about some of the most common conditions that affect women after menopause (postmenopausal women). These include heart disease, cancer, and bone loss (osteoporosis). Adopting a healthy lifestyle and getting preventive care can help to promote your health and wellness. The actions you take can also lower your chances of developing some of these common conditions. What should I know about menopause? During menopause, you may get a number of symptoms, such as:  Hot flashes. These can be moderate or severe.  Night sweats.  Decrease in sex drive.  Mood swings.  Headaches.  Tiredness.  Irritability.  Memory problems.  Insomnia. Choosing to treat or not to treat these symptoms is a decision that you make with your health care provider. Do I need hormone replacement therapy?  Hormone replacement therapy is effective in treating symptoms that are caused by menopause, such as hot flashes and night sweats.  Hormone replacement carries certain risks, especially as you become older. If you are thinking about using estrogen or estrogen with progestin, discuss the benefits and risks with your health care provider. What is my risk for heart disease and stroke? The risk of heart disease, heart attack, and stroke increases as you age. One of the causes may be a change in the body's hormones during menopause. This can affect how your body uses dietary fats, triglycerides, and cholesterol. Heart attack and stroke are medical emergencies. There are many things that you can do to help prevent heart disease and stroke. Watch your  blood pressure  High blood pressure causes heart disease and increases the risk of stroke. This is more likely to develop in people who have high blood pressure readings, are of African descent, or are overweight.  Have your blood pressure checked: ? Every 3-5 years if you are 29-69 years of age. ? Every year if you are 71 years old or older. Eat a healthy diet   Eat a diet that includes plenty of vegetables, fruits, low-fat dairy products, and lean protein.  Do not eat a lot of foods that are high in solid fats, added sugars, or sodium. Get regular exercise Get regular exercise. This is one of the most important things you can do for your health. Most adults should:  Try to exercise for at least 150 minutes each week. The exercise should increase your heart rate and make you sweat (moderate-intensity exercise).  Try to do strengthening exercises at least twice each week. Do these in addition to the moderate-intensity exercise.  Spend less time sitting. Even light physical activity can be beneficial. Other tips  Work with your health care provider to achieve or maintain a healthy weight.  Do not use any products that contain nicotine or tobacco, such as cigarettes, e-cigarettes, and chewing tobacco. If you need help quitting, ask your health care provider.  Know your numbers. Ask your health care provider to check your cholesterol and your blood sugar (glucose). Continue to have your blood tested as directed by your health care provider. Do I need screening for cancer? Depending on your health history and family history, you may need to have cancer screening at different stages of  your life. This may include screening for:  Breast cancer.  Cervical cancer.  Lung cancer.  Colorectal cancer. What is my risk for osteoporosis? After menopause, you may be at increased risk for osteoporosis. Osteoporosis is a condition in which bone destruction happens more quickly than new bone  creation. To help prevent osteoporosis or the bone fractures that can happen because of osteoporosis, you may take the following actions:  If you are 6-35 years old, get at least 1,000 mg of calcium and at least 600 mg of vitamin D per day.  If you are older than age 14 but younger than age 36, get at least 1,200 mg of calcium and at least 600 mg of vitamin D per day.  If you are older than age 25, get at least 1,200 mg of calcium and at least 800 mg of vitamin D per day. Smoking and drinking excessive alcohol increase the risk of osteoporosis. Eat foods that are rich in calcium and vitamin D, and do weight-bearing exercises several times each week as directed by your health care provider. How does menopause affect my mental health? Depression may occur at any age, but it is more common as you become older. Common symptoms of depression include:  Low or sad mood.  Changes in sleep patterns.  Changes in appetite or eating patterns.  Feeling an overall lack of motivation or enjoyment of activities that you previously enjoyed.  Frequent crying spells. Talk with your health care provider if you think that you are experiencing depression. General instructions See your health care provider for regular wellness exams and vaccines. This may include:  Scheduling regular health, dental, and eye exams.  Getting and maintaining your vaccines. These include: ? Influenza vaccine. Get this vaccine each year before the flu season begins. ? Pneumonia vaccine. ? Shingles vaccine. ? Tetanus, diphtheria, and pertussis (Tdap) booster vaccine. Your health care provider may also recommend other immunizations. Tell your health care provider if you have ever been abused or do not feel safe at home. Summary  Menopause is a normal process in which your ability to get pregnant comes to an end.  This condition causes hot flashes, night sweats, decreased interest in sex, mood swings, headaches, or lack of  sleep.  Treatment for this condition may include hormone replacement therapy.  Take actions to keep yourself healthy, including exercising regularly, eating a healthy diet, watching your weight, and checking your blood pressure and blood sugar levels.  Get screened for cancer and depression. Make sure that you are up to date with all your vaccines. This information is not intended to replace advice given to you by your health care provider. Make sure you discuss any questions you have with your health care provider. Document Revised: 07/13/2018 Document Reviewed: 07/13/2018 Elsevier Patient Education  2020 [employer].

## 2020-06-05 NOTE — Progress Notes (Signed)
Heidi Pugh [facility] Aug 18, 1955 [id number]   History:  64 y.o. G2P1 presents for annual exam. 1995 TVH BSO for endometriosis and DUB, no on HRT. Used vaginal estrogen in the past. Normal pap and mammogram history. History of gastric bypass. Anxiety and depression stable on Wellbutrin and Zoloft. Osteopenia. She is leaving for [place] tomorrow for 6 months and then traveling by motor coach until next year. Dexa today.   Gynecologic History No LMP recorded. Patient has had a hysterectomy.   Last Pap: 2010. Results were: Normal Last mammogram: 06/04/2019. Results were: right breast asymmetry. Ultrasound recommended Last colonoscopy: 2015. Results were: normal, 5 year repeat recommended Last Dexa: 06/2015. Results were: t-score -1.2, FRAX 12% / 0.8%  Past medical history, past surgical history, family history and social history were all reviewed and documented in the EPIC chart.  ROS:  A ROS was performed and pertinent positives and negatives are included.  Exam:  Vitals:   06/05/20 0959  BP: 116/74  Weight: 132 lb (59.9 kg)  Height: 5\' 5"  (1.651 m)   Body mass index is 21.97 kg/m.  General appearance:  Normal Thyroid:  Symmetrical, normal in size, without palpable masses or nodularity. Respiratory  Auscultation:  Clear without wheezing or rhonchi Cardiovascular  Auscultation:  Regular rate, without rubs, murmurs or gallops  Edema/varicosities:  Not grossly evident Abdominal  Soft,nontender, without masses, guarding or rebound.  Liver/spleen:  No organomegaly noted  Hernia:  None appreciated  Skin  Inspection:  Grossly normal   Breasts: Examined lying and sitting.   Right: Without masses, retractions, discharge or axillary adenopathy.   Left: Without masses, retractions, discharge or axillary adenopathy. Gentitourinary   Inguinal/mons:  Normal without inguinal adenopathy  External genitalia:  Normal  BUS/Urethra/Skene's glands:  Normal  Vagina:  Atrophic changes  Cervix:   Absent  Uterus:  Absent  Adnexa/parametria:     Rt: Without masses or tenderness.   Lt: Without masses or tenderness.  Anus and perineum: Normal  Digital rectal exam: Normal sphincter tone without palpated masses or tenderness  Assessment/Plan:  64 y.o. G2P1 for annual exam.   Well female exam with routine gynecological exam - Plan: CBC with Differential/Platelet, Comprehensive metabolic panel, Lipid panel. Education provided on SBEs, importance of preventative screenings, current guidelines, high calcium diet, regular exercise, and multivitamin daily.   Osteopenia of neck of right femur - 2016 t-score -1.2 without elevated FRAX. Dexa today. Will review results when available.   History of total vaginal hysterectomy (TVH) - with BSO 1995 for endometriosis and DUB. No HRT.   Menopausal vaginal dryness - tried vaginal estrogen cream for 1 month but does not want to do any more intervention at this time. Not sexually active.   Vitamin D deficiency - Plan: VITAMIN D 25 Hydroxy (Vit-D Deficiency, Fractures). Taking 1000 IUs daily and gets natural sunlight most days.   Depression, unspecified depression type - stable on Wellbutrin and Zoloft.  Screening for cervical cancer -normal Pap history.  Pap today.  If normal we discussed stopping screenings per guidelines and she is agreeable.  Screening for breast cancer -most recent mammogram showed left breast asymmetry with recommendation for ultrasound.  She is leaving for [place] tomorrow and will discuss this with them when a call.  Continue annual screenings.  Normal breast exam today.  Screening for colon cancer -overdue for screening colonoscopy.  Discussed current guidelines and importance of preventative screenings and recommended her schedule soon.  Follow up in 1 year for annual.  Tamela Gammon [facility], 10:05 AM 06/05/2020

## 2020-06-06 LAB — LIPID PANEL
Cholesterol: 183 mg/dL (ref <200)
HDL: 56 mg/dL (ref >=50)
LDL Cholesterol (Calc): 109 mg/dL (calc) — ABNORMAL HIGH
Non-HDL Cholesterol (Calc): 127 mg/dL (calc) (ref <130)
Total CHOL/HDL Ratio: 3.3 (calc) (ref <5.0)
Triglycerides: 88 mg/dL (ref <150)

## 2020-06-06 LAB — COMPREHENSIVE METABOLIC PANEL
AG Ratio: 1.9 (calc) (ref 1.0–2.5)
ALT: 20 U/L (ref 6–29)
AST: 22 U/L (ref 10–35)
Albumin: 4.3 g/dL (ref 3.6–5.1)
Alkaline phosphatase (APISO): 153 U/L (ref 37–153)
BUN: 14 mg/dL (ref 7–25)
CO2: 27 mmol/L (ref 20–32)
Calcium: 9.4 mg/dL (ref 8.6–10.4)
Chloride: 103 mmol/L (ref 98–110)
Creat: 0.58 mg/dL (ref 0.50–0.99)
Globulin: 2.3 g/dL (calc) (ref 1.9–3.7)
Glucose, Bld: 80 mg/dL (ref 65–99)
Potassium: 3.9 mmol/L (ref 3.5–5.3)
Sodium: 141 mmol/L (ref 135–146)
Total Bilirubin: 0.3 mg/dL (ref 0.2–1.2)
Total Protein: 6.6 g/dL (ref 6.1–8.1)

## 2020-06-06 LAB — CBC WITH DIFFERENTIAL/PLATELET
Absolute Monocytes: 525 cells/uL (ref 200–950)
Basophils Absolute: 36 cells/uL (ref 0–200)
Basophils Relative: 0.4 %
Eosinophils Absolute: 107 cells/uL (ref 15–500)
Eosinophils Relative: 1.2 %
HCT: 38.5 % (ref 35.0–45.0)
Hemoglobin: 12 g/dL (ref 11.7–15.5)
MCH: 25.3 pg — ABNORMAL LOW (ref 27.0–33.0)
MCHC: 31.2 g/dL — ABNORMAL LOW (ref 32.0–36.0)
MPV: 11.5 fL (ref 7.5–12.5)
Monocytes Relative: 5.9 %
Neutro Abs: 5767 cells/uL (ref 1500–7800)
Neutrophils Relative %: 64.8 %
Platelets: 271 10*3/uL (ref 140–400)
RDW: 15.6 % — ABNORMAL HIGH (ref 11.0–15.0)
Total Lymphocyte: 27.7 %

## 2020-06-06 LAB — VITAMIN D 25 HYDROXY (VIT D DEFICIENCY, FRACTURES): Vit D, 25-Hydroxy: 23 ng/mL — ABNORMAL LOW (ref 30–100)

## 2020-06-07 LAB — PAP IG W/ RFLX HPV ASCU

## 2020-06-10 ENCOUNTER — Other Ambulatory Visit: Payer: Self-pay | Admitting: Nurse Practitioner

## 2020-06-10 DIAGNOSIS — R928 Other abnormal and inconclusive findings on diagnostic imaging of breast: Secondary | ICD-10-CM

## 2020-06-12 ENCOUNTER — Telehealth: Payer: Self-pay | Admitting: *Deleted

## 2020-06-12 ENCOUNTER — Other Ambulatory Visit: Payer: Self-pay | Admitting: Obstetrics & Gynecology

## 2020-06-12 DIAGNOSIS — M85851 Other specified disorders of bone density and structure, right thigh: Secondary | ICD-10-CM

## 2020-06-12 NOTE — Telephone Encounter (Signed)
Patient called left message in triage voicemail with questions about recent mammogram and additional imaging. I called and left message for patient to call me back to discuss.

## 2020-06-17 ENCOUNTER — Other Ambulatory Visit: Payer: Self-pay | Admitting: *Deleted

## 2020-06-17 MED ORDER — OMEPRAZOLE 20 MG PO CPDR: DELAYED_RELEASE_CAPSULE | ORAL | 4 refills | Status: DC

## 2020-06-17 NOTE — Telephone Encounter (Signed)
Patient was seen for annual exam on 06/05/20 patient said her refill for Prilose 20 mg capsule was never sent to the pharmacy.

## 2020-07-01 ENCOUNTER — Telehealth: Payer: Self-pay

## 2020-07-01 MED ORDER — VITAMIN D (ERGOCALCIFEROL) 1.25 MG (50000 UNIT) PO CAPS: 50000.0000 [IU] | ORAL_CAPSULE | ORAL | 0 refills | Status: DC

## 2020-07-01 NOTE — Telephone Encounter (Signed)
Patient called to let TW know fine to send Vit D Rx to her [facility] where she is. Rx sent.

## 2020-07-08 ENCOUNTER — Other Ambulatory Visit: Payer: Self-pay

## 2020-07-08 DIAGNOSIS — F4323 Adjustment disorder with mixed anxiety and depressed mood: Secondary | ICD-10-CM

## 2020-07-08 MED ORDER — SERTRALINE HCL 50 MG PO TABS: ORAL_TABLET | ORAL | 0 refills | Status: DC

## 2020-08-23 ENCOUNTER — Other Ambulatory Visit: Payer: Self-pay | Admitting: Nurse Practitioner

## 2020-12-16 ENCOUNTER — Ambulatory Visit
Admission: RE | Admit: 2020-12-16 | Discharge: 2020-12-16 | Disposition: A | Discharge status: Home / Self Care | Payer: PRIVATE HEALTH INSURANCE | Source: Ambulatory Visit | Attending: Nurse Practitioner | Admitting: Nurse Practitioner

## 2020-12-16 ENCOUNTER — Other Ambulatory Visit: Payer: Self-pay | Admitting: Nurse Practitioner

## 2020-12-16 ENCOUNTER — Other Ambulatory Visit: Payer: Self-pay

## 2020-12-16 ENCOUNTER — Ambulatory Visit
Admission: RE | Admit: 2020-12-16 | Discharge: 2020-12-16 | Disposition: A | Discharge status: Home / Self Care | Payer: PPO | Source: Ambulatory Visit | Attending: Nurse Practitioner | Admitting: Nurse Practitioner

## 2020-12-16 DIAGNOSIS — R928 Other abnormal and inconclusive findings on diagnostic imaging of breast: Secondary | ICD-10-CM

## 2020-12-16 DIAGNOSIS — R922 Inconclusive mammogram: Secondary | ICD-10-CM | POA: Diagnosis not present

## 2020-12-19 ENCOUNTER — Ambulatory Visit
Admission: RE | Admit: 2020-12-19 | Discharge: 2020-12-19 | Disposition: A | Discharge status: Home / Self Care | Payer: PPO | Source: Ambulatory Visit | Attending: Nurse Practitioner | Admitting: Nurse Practitioner

## 2020-12-19 ENCOUNTER — Other Ambulatory Visit: Payer: Self-pay

## 2020-12-19 DIAGNOSIS — D242 Benign neoplasm of left breast: Secondary | ICD-10-CM | POA: Diagnosis not present

## 2020-12-19 DIAGNOSIS — R928 Other abnormal and inconclusive findings on diagnostic imaging of breast: Secondary | ICD-10-CM

## 2020-12-19 DIAGNOSIS — N6321 Unspecified lump in the left breast, upper outer quadrant: Secondary | ICD-10-CM | POA: Diagnosis not present

## 2021-03-19 ENCOUNTER — Other Ambulatory Visit: Payer: Self-pay

## 2021-03-19 DIAGNOSIS — F4323 Adjustment disorder with mixed anxiety and depressed mood: Secondary | ICD-10-CM

## 2021-03-19 MED ORDER — SERTRALINE HCL 50 MG PO TABS: ORAL_TABLET | ORAL | 0 refills | Status: DC

## 2021-03-19 NOTE — Telephone Encounter (Signed)
Last AEX 06/05/20.

## 2021-04-22 ENCOUNTER — Other Ambulatory Visit: Payer: Self-pay | Admitting: Nurse Practitioner

## 2021-04-22 DIAGNOSIS — Z1231 Encounter for screening mammogram for malignant neoplasm of breast: Secondary | ICD-10-CM

## 2021-05-15 DIAGNOSIS — L989 Disorder of the skin and subcutaneous tissue, unspecified: Secondary | ICD-10-CM | POA: Diagnosis not present

## 2021-05-15 DIAGNOSIS — Z1231 Encounter for screening mammogram for malignant neoplasm of breast: Secondary | ICD-10-CM | POA: Diagnosis not present

## 2021-05-15 DIAGNOSIS — K219 Gastro-esophageal reflux disease without esophagitis: Secondary | ICD-10-CM | POA: Diagnosis not present

## 2021-05-15 DIAGNOSIS — Z90710 Acquired absence of both cervix and uterus: Secondary | ICD-10-CM | POA: Diagnosis not present

## 2021-05-15 DIAGNOSIS — Z903 Acquired absence of stomach [part of]: Secondary | ICD-10-CM | POA: Diagnosis not present

## 2021-05-15 DIAGNOSIS — M85859 Other specified disorders of bone density and structure, unspecified thigh: Secondary | ICD-10-CM | POA: Diagnosis not present

## 2021-05-15 DIAGNOSIS — Z720 Tobacco use: Secondary | ICD-10-CM | POA: Diagnosis not present

## 2021-05-15 DIAGNOSIS — F3341 Major depressive disorder, recurrent, in partial remission: Secondary | ICD-10-CM | POA: Diagnosis not present

## 2021-05-15 DIAGNOSIS — E559 Vitamin D deficiency, unspecified: Secondary | ICD-10-CM | POA: Diagnosis not present

## 2021-05-15 DIAGNOSIS — Z9049 Acquired absence of other specified parts of digestive tract: Secondary | ICD-10-CM | POA: Diagnosis not present

## 2021-12-04 ENCOUNTER — Ambulatory Visit: Payer: PPO

## 2021-12-05 ENCOUNTER — Other Ambulatory Visit: Payer: Self-pay | Admitting: Internal Medicine

## 2021-12-05 DIAGNOSIS — R1013 Epigastric pain: Secondary | ICD-10-CM

## 2021-12-16 ENCOUNTER — Ambulatory Visit
Admission: RE | Admit: 2021-12-16 | Discharge: 2021-12-16 | Disposition: A | Discharge status: Home / Self Care | Payer: PPO | Source: Ambulatory Visit | Attending: Internal Medicine | Admitting: Internal Medicine

## 2021-12-16 DIAGNOSIS — R1013 Epigastric pain: Secondary | ICD-10-CM

## 2021-12-16 MED ORDER — IOPAMIDOL (ISOVUE-300) INJECTION 61%
100.0000 mL | Freq: Once | INTRAVENOUS | Status: AC | PRN
  Administered 2021-12-16: 100 mL via INTRAVENOUS

## 2021-12-17 ENCOUNTER — Ambulatory Visit
Admission: RE | Admit: 2021-12-17 | Discharge: 2021-12-17 | Disposition: A | Discharge status: Home / Self Care | Payer: PPO | Source: Ambulatory Visit | Attending: Nurse Practitioner | Admitting: Nurse Practitioner

## 2021-12-17 DIAGNOSIS — Z1231 Encounter for screening mammogram for malignant neoplasm of breast: Secondary | ICD-10-CM

## 2021-12-23 ENCOUNTER — Encounter: Payer: Self-pay | Admitting: Physician Assistant

## 2022-01-14 ENCOUNTER — Encounter: Payer: Self-pay | Admitting: Physician Assistant

## 2022-01-14 ENCOUNTER — Ambulatory Visit: Payer: PPO | Admitting: Physician Assistant

## 2022-01-14 VITALS — BP 100/70 | HR 71 | Ht 66.0 in | Wt 128.0 lb

## 2022-01-14 DIAGNOSIS — R1013 Epigastric pain: Secondary | ICD-10-CM | POA: Diagnosis not present

## 2022-01-14 DIAGNOSIS — K219 Gastro-esophageal reflux disease without esophagitis: Secondary | ICD-10-CM

## 2022-01-14 DIAGNOSIS — Z800 Family history of malignant neoplasm of digestive organs: Secondary | ICD-10-CM | POA: Diagnosis not present

## 2022-01-14 DIAGNOSIS — Z9884 Bariatric surgery status: Secondary | ICD-10-CM

## 2022-01-14 DIAGNOSIS — R935 Abnormal findings on diagnostic imaging of other abdominal regions, including retroperitoneum: Secondary | ICD-10-CM

## 2022-01-14 NOTE — Patient Instructions (Signed)
I will call you to schedule your endoscopy/colonoscopy in October

## 2022-01-14 NOTE — Progress Notes (Signed)
Chief Complaint: Epigastric pain  HPI:    Heidi Pugh is a 66 year old female, known to Dr. Silverio Decamp, with a past medical history as listed below,  who was referred to me by Leeroy Cha,* for a complaint of epigastric pain.      05/04/2014 colonoscopy done for family history of colon cancer in her father was normal.  Repeat recommended in 10 years.    07/09/2015 patient seen in clinic by Alonza Bogus for elevated LFTs.  At that time rechecked an ultrasound and ordered some further labs.    12/17/2021 office visit with PCP.  At that time discussed follow-up for left upper quadrant, epigastric pain and GERD.  Discussed she lives in [place] but visited [place].  Had a history of GERD with multiple gastric surgeries.  Also history of gastric sleeve and bypass in October 2015.  As well as history of gallbladder surgery in 2016.  At that time recommended she follow-up with [place] to discuss endoscopy given ongoing reflux, left upper quadrant pain and epigastric pain.    12/17/2021 CT of the abdomen pelvis with contrast for epigastric pain and history of bariatric surgery.  Patient postcholecystectomy with intra and extrahepatic biliary ductal dilation with progressive CBD dilation since 2016 with no visualized choledocholithiasis discussed correlation with LFTs, if LFTs elevated recommended MRCP.  There is a degree of intrahepatic biliary ductal dilation similar to prior exam.  Bariatric surgery without complication moderate colonic stool burden.  Also small fat-containing infraumbilical ventral abdominal wall hernia.    Today, the patient presents to clinic accompanied by her husband.  She explains that she has been having some epigastric pain and breakthrough reflux symptoms off and on.  Describes epigastric pain and tells me the only time she feels it is if she is in the car driving for more than 30 minutes at a time.  She gets a severe epigastric pain which is a 10/10 that makes her want to pull  over and get up and walk around so that she can calm it down.  Tells me they also have an RV which they drive around a lot and this allows her to get up out of her chair and go lay down in the bed and then she does not get this pain.  Sometimes she is able to release some gas which makes her feel better.  Does tell me she uses a stool softener for occasional constipation.  Tells me "I know I have ulcers", and she is on Omeprazole 20 mg every morning at the moment.  She has tried this at an increased dose when she had breakthrough reflux symptoms and tells me it helped for that.    Family history of what sounds like colon cancer in her father.  She tells me they found 3 polyps at time of his colonoscopy and one of them looked "blegh", and they had to remove a section of his colon due to this.    Does tell me that they travel a lot and live out of an RV and are quite nomadic.  She tells me she will likely not be back in town in order to have procedures until October.    Denies fever, chills, weight loss or blood in her stool.  Past Medical History:  Diagnosis Date   Arthritis    knees bilat   Cataracts, bilateral    Cecal ulcer 10/02/2006   solitary. Path: benign acute on chronic inflammation.     Choledocholithiasis  Complication of anesthesia    " i HAVE A HARD TIME WAKING "   Depression with anxiety    Dyslipidemia    GERD (gastroesophageal reflux disease)    Obesity    Osteopenia 06/04/2015   T score -1.2 FRAX 12%/0.8%   Pneumonia 10/02/2006   Sleep apnea    cpap every night   Vitamin D deficiency     Past Surgical History:  Procedure Laterality Date   ABDOMINAL HYSTERECTOMY  2005   LAVH-BSO,, MENORRHAGIA AND PELVIC PAIN   APPENDECTOMY  ~1940   CATARACT EXTRACTION     COLONOSCOPY  2008   HYSTEROSCOPY     D & C   laparoscopic sleeve gastric bypass  04-2014, [facility] in [place]      Current Outpatient Medications   Medication Sig Dispense Refill   buPROPion (WELLBUTRIN) 75 MG tablet Take 1 pill a day 90 tablet 4   calcium carbonate (OSCAL) 1500 (600 CA) MG TABS tablet Take 1 tablet by mouth 2 (two) times daily.     Cholecalciferol (VITAMIN D-1000 MAX ST) 1000 UNITS tablet Take 4,000 Units by mouth daily.     Multiple Vitamin (MULTIVITAMIN WITH MINERALS) TABS tablet Take 1 tablet by mouth daily.     omeprazole (PRILOSEC) 20 MG capsule Take two daily 180 capsule 4   sertraline (ZOLOFT) 50 MG tablet Take 1 daily 90 tablet 0   Vitamin D, Ergocalciferol, (DRISDOL) 1.25 MG (50000 UNIT) CAPS capsule Take 1 capsule (50,000 Units total) by mouth every 7 (seven) days. 8 capsule 0   No current facility-administered medications for this visit.    Allergies as of 01/14/2022 - Review Complete 06/05/2020  Allergen Reaction Noted   Acetaminophen Nausea And Vomiting 06/18/2015   Diazepam Other (See Comments) 06/18/2015   Hydrocodone-acetaminophen Other (See Comments) 06/18/2015   Propoxyphene Nausea And Vomiting and Other (See Comments) 06/18/2015   Aspirin Other (See Comments) 01/08/2011   Darvocet [propoxyphene n-acetaminophen] Other (See Comments) 01/08/2011   Etodolac Other (See Comments) 01/08/2011   Flagyl [metronidazole hcl] Other (See Comments) 01/08/2011   Hydrocodone-acetaminophen Other (See Comments) 01/08/2011   Nsaids Other (See Comments) 01/08/2011   Terazol [terconazole] Other (See Comments) 01/08/2011   Ultram Woodroe Mode hcl] Other (See Comments) 01/08/2011   Valium Other (See Comments) 01/08/2011   Codeine Rash 01/08/2011   Metronidazole Other (See Comments) 06/18/2015   Penicillins Rash 08/05/2011    Family History  Problem Relation Age of Onset   Colon cancer Father    Prostate cancer Father    Diabetes Sister    Diabetes Sister    Cholelithiasis Other        mom, dad, sisters, daughter   Breast cancer Paternal Grandmother     Social History   Socioeconomic History   Marital  status: Married    Spouse name: Not on file   Number of children: Not on file   Years of education: Not on file   Highest education level: Not on file  Occupational History   Not on file  Tobacco Use   Smoking status: Some Days    Packs/day: 0.50    Years: 20.00    Total pack years: 10.00    Types: Cigarettes   Smokeless tobacco: Never  Vaping Use   Vaping Use: Never used  Substance and Sexual Activity   Alcohol use: Yes    Comment: rare; one glass per year  Drug use: No   Sexual activity: Not Currently    Comment: Declined sexual Hx questions  Other Topics Concern   Not on file  Social History Narrative   Not on file   Social Determinants of Health   Financial Resource Strain: Not on file  Food Insecurity: Not on file  Transportation Needs: Not on file  Physical Activity: Not on file  Stress: Not on file  Social Connections: Not on file  Intimate Partner Violence: Not on file    Review of Systems:    Constitutional: No weight loss, fever or chills Skin: No rash  Cardiovascular: No chest pain Respiratory: No SOB  Gastrointestinal: See HPI and otherwise negative Genitourinary: No dysuria or change in urinary frequency Neurological: No headache, dizziness or syncope Musculoskeletal: No new muscle or joint pain Hematologic: No bleeding  Psychiatric: No history of depression or anxiety   Physical Exam:  Vital signs: BP 100/70   Pulse 71   Ht '5\' 6"'$  (1.676 m)   Wt 128 lb (58.1 kg)   BMI 20.66 kg/m    Constitutional:   Pleasant Caucasian female appears to be in NAD, Well developed, Well nourished, alert and cooperative Head:  Normocephalic and atraumatic. Eyes:   PEERL, EOMI. No icterus. Conjunctiva pink. Ears:  Normal auditory acuity. Neck:  Supple Throat: Oral cavity and pharynx without inflammation, swelling or lesion.  Respiratory: Respirations even and unlabored. Lungs clear to auscultation bilaterally.   No wheezes, crackles, or rhonchi.   Cardiovascular: Normal S1, S2. No MRG. Regular rate and rhythm. No peripheral edema, cyanosis or pallor.  Gastrointestinal:  Soft, nondistended,mild epigastric ttp. No rebound or guarding. Normal bowel sounds. No appreciable masses or hepatomegaly. Rectal:  Not performed.  Msk:  Symmetrical without gross deformities. Without edema, no deformity or joint abnormality.  Neurologic:  Alert and  oriented x4;  grossly normal neurologically.  Skin:   Dry and intact without significant lesions or rashes. Psychiatric: Demonstrates good judgement and reason without abnormal affect or behaviors.  See HPI for imaging and requesting recent bloodwork.  Assessment: 1.  Epigastric pain: Occurs only when she is sitting for long period of time in the car, seems to get better when she is able to stretch out and move around; consider gas versus IBS versus other 2.  History of bariatric surgery 3.  Family history of colon cancer: In her father, patient overdue for surveillance colonoscopy 4.  GERD: Typically controlled with diet and lifestyle modifications as well as Omeprazole 20 mg every morning 5.  Abnormal CT of the abdomen: With question of dilated bile ducts, requesting recent labs to correlate  Plan: 1.  Requesting recent labs from PCP.  If LFTs were elevated at time of imaging then would recommend an MRI/MRCP for further evaluation of dilated bile ducts. 2.  For now schedule patient for diagnostic EGD and surveillance colonoscopy given family history of colon cancer in her father in the [place] with Dr. Silverio Decamp.  Patient tells me that she will not be available until October to do these procedures.  We did give her a variety of days to choose from before that.  Did provide the patient a detailed list of risks for the procedures and she agrees to proceed. Patient is appropriate for endoscopic procedure(s) in the ambulatory ([facility]) setting.  3.  If patient has increased pain or reflux symptoms in the interim she  could try increasing her Omeprazole up to 40 mg twice a day.  Discussed this at time of  her visit.  For now she would like to stay with what she is doing as her reflux is under control. 4.  Patient to follow in clinic per recommendations after above.  Heidi Newer, PA-C [facility] 01/14/2022, 10:42 AM  Cc: Leeroy Cha,*

## 2022-04-08 ENCOUNTER — Encounter: Payer: Self-pay | Admitting: Gastroenterology

## 2022-04-10 ENCOUNTER — Telehealth: Payer: Self-pay | Admitting: Physician Assistant

## 2022-04-10 NOTE — Telephone Encounter (Signed)
Patient called states she is n longer seeing her PCP. She is wondering if she can get a script called for Omeprazole sometime in October. Please call to discuss further and advise.

## 2022-04-16 MED ORDER — OMEPRAZOLE 20 MG PO CPDR: DELAYED_RELEASE_CAPSULE | ORAL | 3 refills | Status: DC

## 2022-04-16 NOTE — Telephone Encounter (Signed)
Sent script for Omeprazole to patient's pharmacy.

## 2022-05-01 ENCOUNTER — Ambulatory Visit (AMBULATORY_SURGERY_CENTER): Payer: Self-pay

## 2022-05-01 VITALS — Ht 65.0 in | Wt 123.0 lb

## 2022-05-01 DIAGNOSIS — R1013 Epigastric pain: Secondary | ICD-10-CM

## 2022-05-01 DIAGNOSIS — Z1211 Encounter for screening for malignant neoplasm of colon: Secondary | ICD-10-CM

## 2022-05-01 DIAGNOSIS — Z800 Family history of malignant neoplasm of digestive organs: Secondary | ICD-10-CM

## 2022-05-01 MED ORDER — NA SULFATE-K SULFATE-MG SULF 17.5-3.13-1.6 GM/177ML PO SOLN: 1.0000 | Freq: Once | ORAL | 0 refills | Status: AC

## 2022-05-01 NOTE — Progress Notes (Signed)
No egg or soy allergy known to patient  No issues known to pt with past sedation with any surgeries or procedures Patient denies ever being told they had issues or difficulty with intubation  No FH of Malignant Hyperthermia Pt is not on diet pills Pt is not on  home 02  Pt is not on blood thinners  Pt denies issues with constipation  No A fib or A flutter Have any cardiac testing pending--no Pt instructed to use Single[employer]com or GoodRx for a price reduction on prep   

## 2022-05-19 ENCOUNTER — Encounter: Payer: Self-pay | Admitting: Gastroenterology

## 2022-05-23 ENCOUNTER — Encounter: Payer: Self-pay | Admitting: Certified Registered Nurse Anesthetist

## 2022-05-27 ENCOUNTER — Encounter: Payer: Self-pay | Admitting: Gastroenterology

## 2022-05-27 ENCOUNTER — Ambulatory Visit (AMBULATORY_SURGERY_CENTER): Payer: PPO | Admitting: Gastroenterology

## 2022-05-27 VITALS — BP 126/48 | HR 74 | Temp 98.2°F | Resp 10 | Ht 66.0 in | Wt 123.0 lb

## 2022-05-27 DIAGNOSIS — D123 Benign neoplasm of transverse colon: Secondary | ICD-10-CM

## 2022-05-27 DIAGNOSIS — Z1211 Encounter for screening for malignant neoplasm of colon: Secondary | ICD-10-CM | POA: Diagnosis not present

## 2022-05-27 DIAGNOSIS — Z800 Family history of malignant neoplasm of digestive organs: Secondary | ICD-10-CM | POA: Diagnosis not present

## 2022-05-27 DIAGNOSIS — R1013 Epigastric pain: Secondary | ICD-10-CM

## 2022-05-27 MED ORDER — SODIUM CHLORIDE 0.9 % IV SOLN: 500.0000 mL | Freq: Once | INTRAVENOUS | Status: DC

## 2022-05-27 NOTE — Progress Notes (Signed)
Report given to PACU, vss 

## 2022-05-27 NOTE — Progress Notes (Signed)
Called to room to assist during endoscopic procedure.  Patient ID and intended procedure confirmed with present staff. Received instructions for my participation in the procedure from the performing physician.  

## 2022-05-27 NOTE — Op Note (Signed)
[facility] Patient Name: Heidi Pugh Procedure Date: 05/27/2022 2:42 PM MRN: [id number] Endoscopist: Mauri Pole , MD, [id number] Age: 66 Referring MD:  Date of Birth: [birth year] Gender: Female Account #: [id number] Procedure:                Upper GI endoscopy Indications:              Epigastric abdominal pain Medicines:                Monitored Anesthesia Care Procedure:                Pre-Anesthesia Assessment:                           - Prior to the procedure, a History and Physical                            was performed, and patient medications and                            allergies were reviewed. The patient's tolerance of                            previous anesthesia was also reviewed. The risks                            and benefits of the procedure and the sedation                            options and risks were discussed with the patient.                            All questions were answered, and informed consent                            was obtained. Prior Anticoagulants: The patient has                            taken no anticoagulant or antiplatelet agents. ASA                            Grade Assessment: II - A patient with mild systemic                            disease. After reviewing the risks and benefits,                            the patient was deemed in satisfactory condition to                            undergo the procedure.                           After obtaining informed consent, the endoscope was  passed under direct vision. Throughout the                            procedure, the patient's blood pressure, pulse, and                            oxygen saturations were monitored continuously. The                            Endoscope was introduced through the mouth, and                            advanced to the jejunum. The upper GI endoscopy was                            accomplished without  difficulty. The patient                            tolerated the procedure well. Scope In: Scope Out: Findings:                 The Z-line was regular and was found 38 cm from the                            incisors.                           No gross lesions were noted in the entire esophagus.                           Evidence of a Roux-en-Y gastrojejunostomy was                            found. The gastrojejunal anastomosis was                            characterized by healthy appearing mucosa. This was                            traversed. The pouch-to-jejunum limb was                            characterized by healthy appearing mucosa. The                            jejunojejunal anastomosis was characterized by                            healthy appearing mucosa. Complications:            No immediate complications. Estimated Blood Loss:     Estimated blood loss was minimal. Impression:               - Z-line regular, 38 cm from the incisors.                           -  No gross lesions in the entire esophagus.                           - Roux-en-Y gastrojejunostomy with gastrojejunal                            anastomosis characterized by healthy appearing                            mucosa.                           - No specimens collected. Recommendation:           - Patient has a contact number available for                            emergencies. The signs and symptoms of potential                            delayed complications were discussed with the                            patient. Return to normal activities tomorrow.                            Written discharge instructions were provided to the                            patient.                           - Resume previous diet.                           - Continue present medications.                           - See the other procedure note for documentation of                            additional  recommendations. Mauri Pole, MD 05/27/2022 3:28:00 PM This report has been signed electronically.

## 2022-05-27 NOTE — Patient Instructions (Signed)
YOU HAD AN ENDOSCOPIC PROCEDURE TODAY AT [facility]:   Refer to the procedure report that was given to you for any specific questions about what was found during the examination.  If the procedure report does not answer your questions, please call your gastroenterologist to clarify.  If you requested that your care partner not be given the details of your procedure findings, then the procedure report has been included in a sealed envelope for you to review at your convenience later.  **Handouts given on polyps, diverticulosis and hemorrhoids**  YOU SHOULD EXPECT: Some feelings of bloating in the abdomen. Passage of more gas than usual.  Walking can help get rid of the air that was put into your GI tract during the procedure and reduce the bloating. If you had a lower endoscopy (such as a colonoscopy or flexible sigmoidoscopy) you may notice spotting of blood in your stool or on the toilet paper. If you underwent a bowel prep for your procedure, you may not have a normal bowel movement for a few days.  Please Note:  You might notice some irritation and congestion in your nose or some drainage.  This is from the oxygen used during your procedure.  There is no need for concern and it should clear up in a day or so.  SYMPTOMS TO REPORT IMMEDIATELY:  Following lower endoscopy (colonoscopy or flexible sigmoidoscopy):  Excessive amounts of blood in the stool  Significant tenderness or worsening of abdominal pains  Swelling of the abdomen that is new, acute  Fever of 100F or higher  Following upper endoscopy (EGD)  Vomiting of blood or coffee ground material  New chest pain or pain under the shoulder blades  Painful or persistently difficult swallowing  New shortness of breath  Fever of 100F or higher  Black, tarry-looking stools  For urgent or emergent issues, a gastroenterologist can be reached at any hour by calling [phone]. Do not use MyChart messaging for urgent  concerns.    DIET:  We do recommend a small meal at first, but then you may proceed to your regular diet.  Drink plenty of fluids but you should avoid alcoholic beverages for 24 hours.  ACTIVITY:  You should plan to take it easy for the rest of today and you should NOT DRIVE or use heavy machinery until tomorrow (because of the sedation medicines used during the test).    FOLLOW UP: Our staff will call the number listed on your records the next business day following your procedure.  We will call around 7:15- 8:00 am to check on you and address any questions or concerns that you may have regarding the information given to you following your procedure. If we do not reach you, we will leave a message.     If any biopsies were taken you will be contacted by phone or by letter within the next 1-3 weeks.  Please call us at [phone] if you have not heard about the biopsies in 3 weeks.    SIGNATURES/CONFIDENTIALITY: You and/or your care partner have signed paperwork which will be entered into your electronic medical record.  These signatures attest to the fact that that the information above on your After Visit Summary has been reviewed and is understood.  Full responsibility of the confidentiality of this discharge information lies with you and/or your care-partner.

## 2022-05-27 NOTE — Progress Notes (Signed)
Pt's states no medical or surgical changes since previsit or office visit. 

## 2022-05-27 NOTE — Progress Notes (Signed)
1451 Robinul 0.1 mg IV given due large amount of secretions upon assessment.  MD made aware, vss

## 2022-05-27 NOTE — Progress Notes (Signed)
[facility] History and Physical   Primary Care Physician:  Pcp, No   Reason for Procedure:  Epigastric abdominal pain, family h/o colon cancer  Plan:    EGD and colonoscopy with possible interventions as needed     HPI: Heidi Pugh is a very pleasant 66 y.o. female here for EGD and colonoscopy for epigastric abdominal pain and family h/o colon cancer   The risks and benefits as well as alternatives of endoscopic procedure(s) have been discussed and reviewed. All questions answered. The patient agrees to proceed.    Past Medical History:  Diagnosis Date   Arthritis    knees bilat   Cataracts, bilateral    Cecal ulcer 10/02/2006   solitary. Path: benign acute on chronic inflammation.     Choledocholithiasis    Complication of anesthesia    " i HAVE A HARD TIME WAKING "   Depression with anxiety    Dyslipidemia    GERD (gastroesophageal reflux disease)    Obesity    Osteopenia 06/04/2015   T score -1.2 FRAX 12%/0.8%   Pneumonia 10/02/2006   Sleep apnea    cpap every night   Vitamin D deficiency     Past Surgical History:  Procedure Laterality Date   ABDOMINAL HYSTERECTOMY  2005   LAVH-BSO,, MENORRHAGIA AND PELVIC PAIN   APPENDECTOMY  ~1940   CATARACT EXTRACTION     COLONOSCOPY  2008   HYSTEROSCOPY     D & C   laparoscopic sleeve gastric bypass  04-2014, [facility] in [place]      Prior to Admission medications   Medication Sig Start Date End Date Taking? Authorizing Provider  omeprazole (PRILOSEC) 20 MG capsule Take two daily 04/16/22  Yes Levin Erp, PA  sertraline (ZOLOFT) 50 MG tablet Take 1 daily 03/19/21  Yes Juleen China, Tiffany A, NP  Cholecalciferol (VITAMIN D-1000 MAX ST) 1000 UNITS tablet Take 4,000 Units by mouth daily.    [provider]  Multiple Vitamin (MULTIVITAMIN WITH MINERALS) TABS tablet Take 1 tablet by mouth daily.    [provider]     Current Outpatient Medications  Medication Sig Dispense Refill   omeprazole (PRILOSEC) 20 MG capsule Take two daily 180 capsule 3   sertraline (ZOLOFT) 50 MG tablet Take 1 daily 90 tablet 0   Cholecalciferol (VITAMIN D-1000 MAX ST) 1000 UNITS tablet Take 4,000 Units by mouth daily.     Multiple Vitamin (MULTIVITAMIN WITH MINERALS) TABS tablet Take 1 tablet by mouth daily.     Current Facility-Administered Medications  Medication Dose Route Frequency Provider Last Rate Last Admin   0.9 %  sodium chloride infusion  500 mL Intravenous Once Mauri Pole, MD        Allergies as of 05/27/2022 - Review Complete 05/27/2022  Allergen Reaction Noted   Acetaminophen Nausea And Vomiting 06/18/2015   Diazepam Other (See Comments) 06/18/2015   Hydrocodone-acetaminophen Other (See Comments) 06/18/2015   Propoxyphene Nausea And Vomiting and Other (See Comments) 06/18/2015   Aspirin Other (See Comments) 01/08/2011   Darvocet [propoxyphene n-acetaminophen] Nausea And Vomiting 01/08/2011   Etodolac Other (See Comments) 01/08/2011   Flagyl [metronidazole hcl] Other (See Comments) 01/08/2011   Hydrocodone-acetaminophen Other (See Comments) 01/08/2011   Nsaids Other (See Comments) 01/08/2011   Terazol [terconazole] Other (See Comments) 01/08/2011   Ultram Woodroe Mode hcl] Other (See Comments) 01/08/2011   Valium Other (See Comments) 01/08/2011  Codeine Rash 01/08/2011   Metronidazole Other (See Comments) 06/18/2015   Penicillins Rash 08/05/2011    Family History  Problem Relation Age of Onset   Colon cancer Father    Prostate cancer Father    Diabetes Sister    Diabetes Sister    Breast cancer Paternal Grandmother    Cholelithiasis Other        mom, dad, sisters, daughter   Esophageal cancer Neg Hx    Stomach cancer Neg Hx    Rectal cancer Neg Hx     Social History   Socioeconomic History   Marital status: Married    Spouse name: Not on file   Number of children: Not on file    Years of education: Not on file   Highest education level: Not on file  Occupational History   Not on file  Tobacco Use   Smoking status: Some Days    Packs/day: 0.50    Years: 20.00    Total pack years: 10.00    Types: Cigarettes   Smokeless tobacco: Never  Vaping Use   Vaping Use: Never used  Substance and Sexual Activity   Alcohol use: Yes    Comment: rare; a few ounces very rarely   Drug use: No   Sexual activity: Not Currently    Comment: Declined sexual Hx questions  Other Topics Concern   Not on file  Social History Narrative   Not on file   Social Determinants of Health   Financial Resource Strain: Not on file  Food Insecurity: Not on file  Transportation Needs: Not on file  Physical Activity: Not on file  Stress: Not on file  Social Connections: Not on file  Intimate Partner Violence: Not on file    Review of Systems:  All other review of systems negative except as mentioned in the HPI.  Physical Exam: Vital signs in last 24 hours: Blood Pressure (Abnormal) 112/50   Pulse 62   Temperature 98.2 F (36.8 C) (Temporal)   Height '5\' 6"'$  (1.676 m)   Weight 123 lb (55.8 kg)   Oxygen Saturation 100%   Body Mass Index 19.85 kg/m  General:   Alert, NAD Lungs:  Clear .   Heart:  Regular rate and rhythm Abdomen:  Soft, nontender and nondistended. Neuro/Psych:  Alert and cooperative. Normal mood and affect. A and O x 3  Reviewed labs, radiology imaging, old records and pertinent past GI work up  Patient is appropriate for planned procedure(s) and anesthesia in an ambulatory setting   K. Denzil Magnuson , MD [phone]

## 2022-05-27 NOTE — Op Note (Signed)
[place] Patient Name: Heidi Pugh Procedure Date: 05/27/2022 2:35 PM MRN: [id number] Endoscopist: Mauri Pole , MD, [id number] Age: 66 Referring MD:  Date of Birth: [birth year] Gender: Female Account #: [id number] Procedure:                Colonoscopy Indications:              Screening in patient at increased risk: Family                            history of 1st-degree relative with colorectal                            cancer Medicines:                Monitored Anesthesia Care Procedure:                Pre-Anesthesia Assessment:                           - Prior to the procedure, a History and Physical                            was performed, and patient medications and                            allergies were reviewed. The patient's tolerance of                            previous anesthesia was also reviewed. The risks                            and benefits of the procedure and the sedation                            options and risks were discussed with the patient.                            All questions were answered, and informed consent                            was obtained. Prior Anticoagulants: The patient has                            taken no anticoagulant or antiplatelet agents. ASA                            Grade Assessment: II - A patient with mild systemic                            disease. After reviewing the risks and benefits,                            the patient was deemed in satisfactory condition to  undergo the procedure.                           After obtaining informed consent, the colonoscope                            was passed under direct vision. Throughout the                            procedure, the patient's blood pressure, pulse, and                            oxygen saturations were monitored continuously. The                            PCF-HQ190L Colonoscope was introduced through the                             anus and advanced to the the cecum, identified by                            appendiceal orifice and ileocecal valve. The                            colonoscopy was performed without difficulty. The                            patient tolerated the procedure well. The quality                            of the bowel preparation was adequate to identify                            polyps greater than 5 mm in size. The terminal                            ileum, ileocecal valve, appendiceal orifice, and                            rectum were photographed. Scope In: 3:00:44 PM Scope Out: 3:18:51 PM Scope Withdrawal Time: 0 hours 11 minutes 24 seconds  Total Procedure Duration: 0 hours 18 minutes 7 seconds  Findings:                 The perianal and digital rectal examinations were                            normal.                           Two sessile polyps were found in the transverse                            colon. The polyps were 5 to 7 mm in size. These  polyps were removed with a cold snare. Resection                            and retrieval were complete.                           A few small-mouthed diverticula were found in the                            sigmoid colon.                           Non-bleeding external and internal hemorrhoids were                            found during retroflexion. The hemorrhoids were                            medium-sized. Complications:            No immediate complications. Estimated Blood Loss:     Estimated blood loss was minimal. Impression:               - Two 5 to 7 mm polyps in the transverse colon,                            removed with a cold snare. Resected and retrieved.                           - Diverticulosis in the sigmoid colon.                           - Non-bleeding external and internal hemorrhoids. Recommendation:           - Patient has a contact number available for                             emergencies. The signs and symptoms of potential                            delayed complications were discussed with the                            patient. Return to normal activities tomorrow.                            Written discharge instructions were provided to the                            patient.                           - Resume previous diet.                           - Continue present medications.                           -  Await pathology results.                           - Repeat colonoscopy in 5 years for surveillance                            based on pathology results. Mauri Pole, MD 05/27/2022 3:24:13 PM This report has been signed electronically.

## 2022-05-27 NOTE — Progress Notes (Signed)
1515 Ephedrine 5 mg given IV due to low BP, MD updated.

## 2022-05-28 ENCOUNTER — Telehealth: Payer: Self-pay

## 2022-05-28 NOTE — Telephone Encounter (Signed)
  Follow up Call-     05/27/2022    2:19 PM  Call back number  Post procedure Call Back phone  # [phone]  Permission to leave phone message Yes     Patient questions:  Do you have a fever, pain , or abdominal swelling? No. Pain Score  0 *  Have you tolerated food without any problems? Yes.    Have you been able to return to your normal activities? Yes.    Do you have any questions about your discharge instructions: Diet   No. Medications  No. Follow up visit  No.  Do you have questions or concerns about your Care? No.  Actions: * If pain score is 4 or above: No action needed, pain <4.

## 2022-06-02 ENCOUNTER — Telehealth: Payer: Self-pay | Admitting: Gastroenterology

## 2022-06-02 ENCOUNTER — Other Ambulatory Visit: Payer: Self-pay

## 2022-06-02 MED ORDER — OMEPRAZOLE 20 MG PO CPDR: DELAYED_RELEASE_CAPSULE | ORAL | 3 refills | Status: DC

## 2022-06-02 NOTE — Telephone Encounter (Signed)
Patient advised no cancer in the biopsy.

## 2022-06-02 NOTE — Telephone Encounter (Signed)
Patient called requesting Omeprazole refills for 30 days. Also requested a call once done so she knows to pick it up

## 2022-06-02 NOTE — Telephone Encounter (Signed)
Inbound call from patient requesting a call back to discuss if her pathology report had come back from her colonoscopy and endoscopy on 10/25. Please advise.

## 2022-06-02 NOTE — Telephone Encounter (Signed)
Message sent to patient that script was sent to her pharmacy

## 2022-06-06 IMAGING — MG MM DIGITAL SCREENING BILAT W/ TOMO AND CAD
8 series · 8 of 24 positions shown · non-contrast
Comparison: Previous exam(s).

CLINICAL DATA: Screening.

EXAM:
DIGITAL SCREENING BILATERAL MAMMOGRAM WITH TOMOSYNTHESIS AND CAD
TECHNIQUE: Bilateral screening digital craniocaudal and mediolateral oblique
mammograms were obtained. Bilateral screening digital breast
tomosynthesis was performed. The images were evaluated with
computer-aided detection.

[L MLO synth-2D]
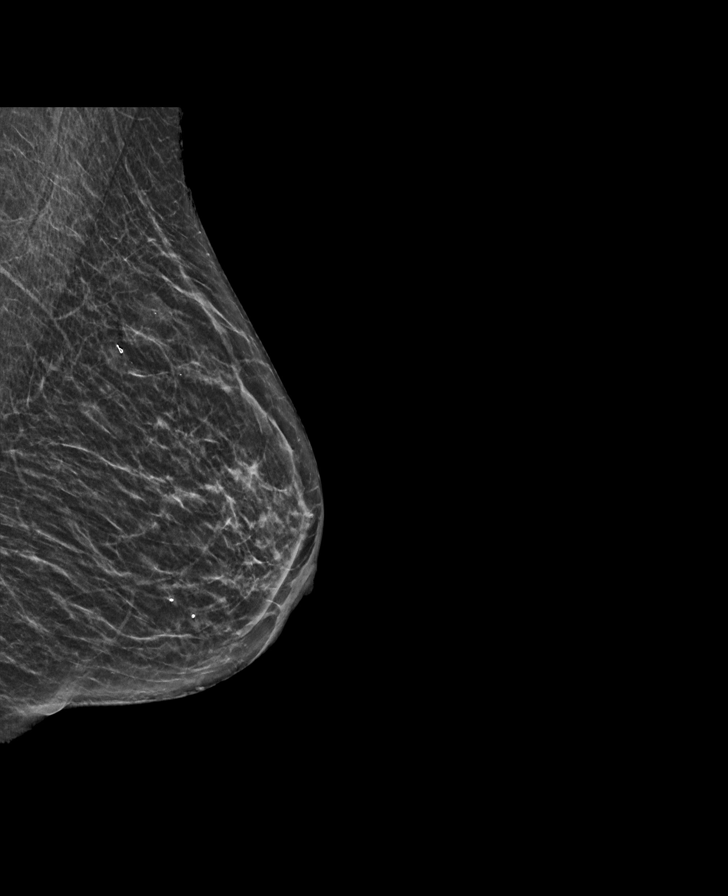

[R MLO synth-2D]
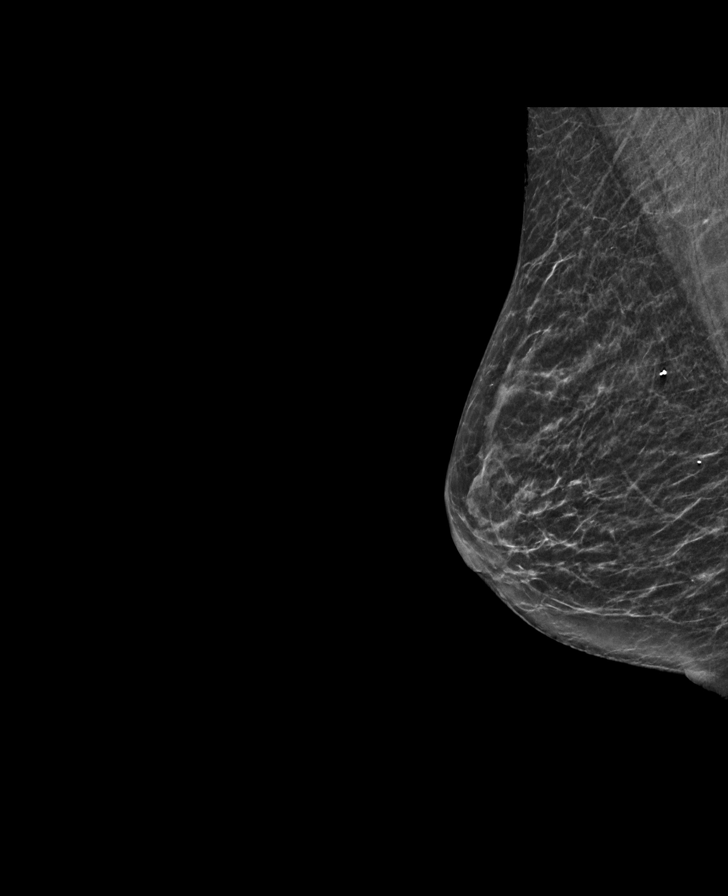

[R CC synth-2D]
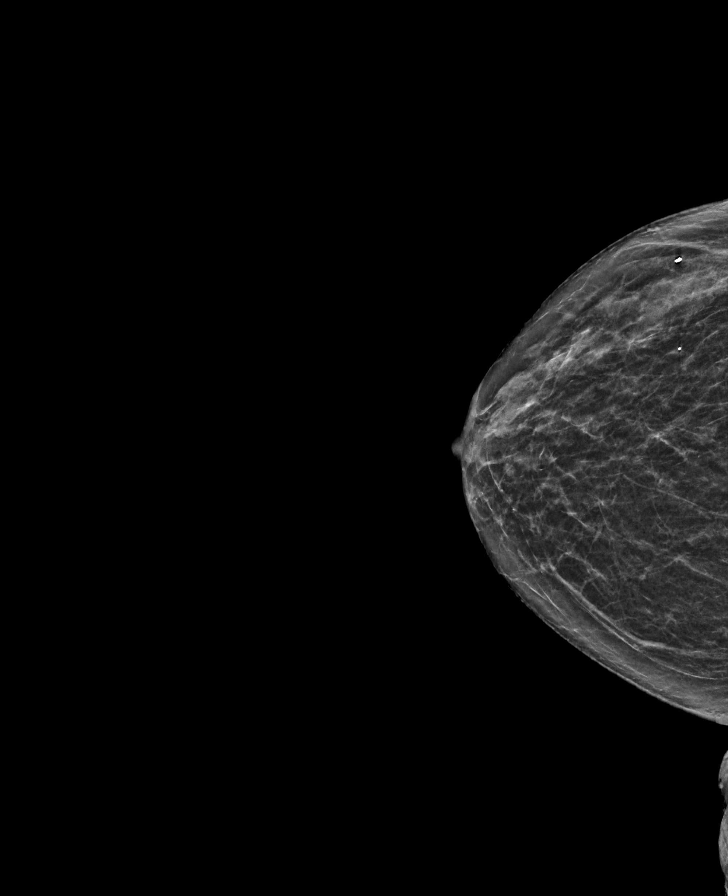

[L CC synth-2D]
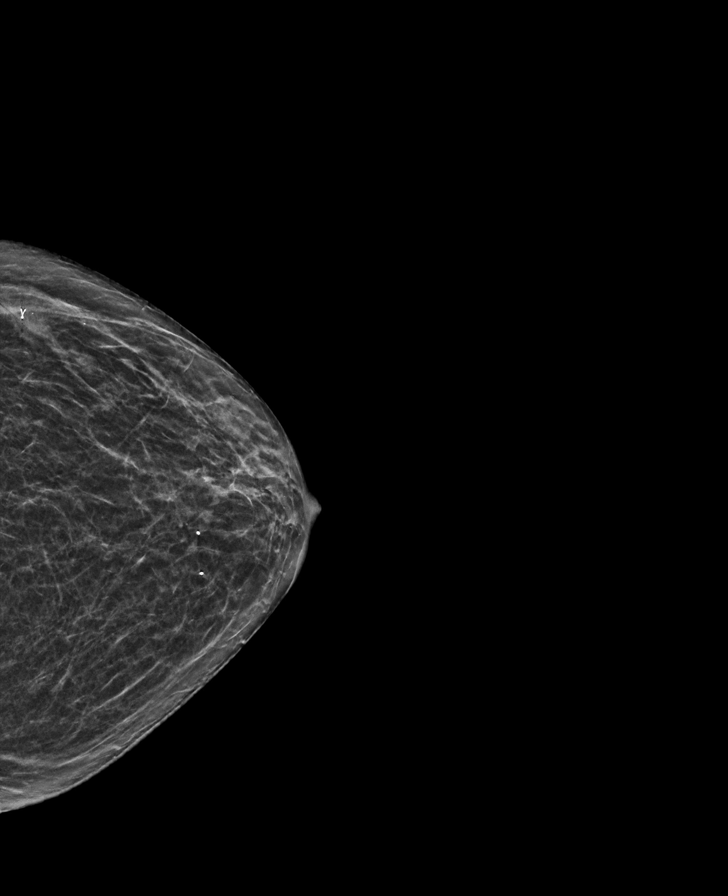

[R MLO tomo · tomo slice 25/48.0]
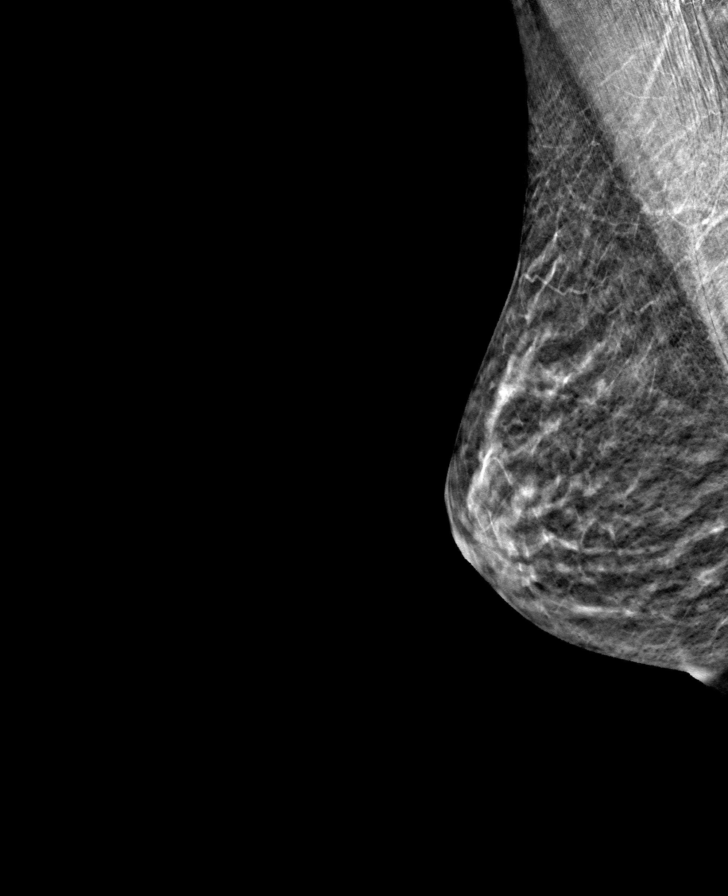

[L MLO tomo · tomo slice 25/50.0]
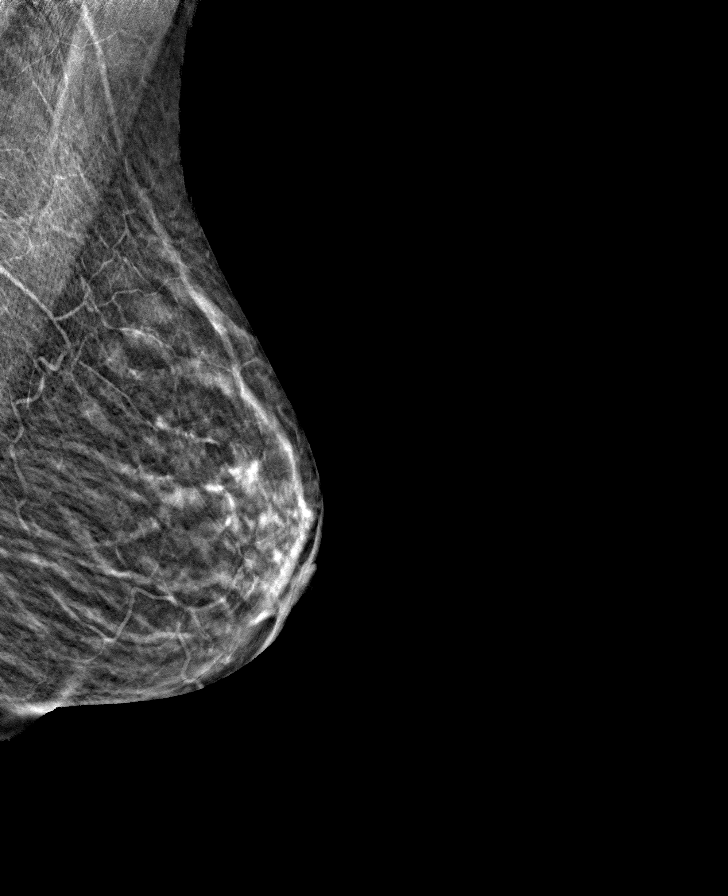

[L CC tomo · tomo slice 25/50.0]
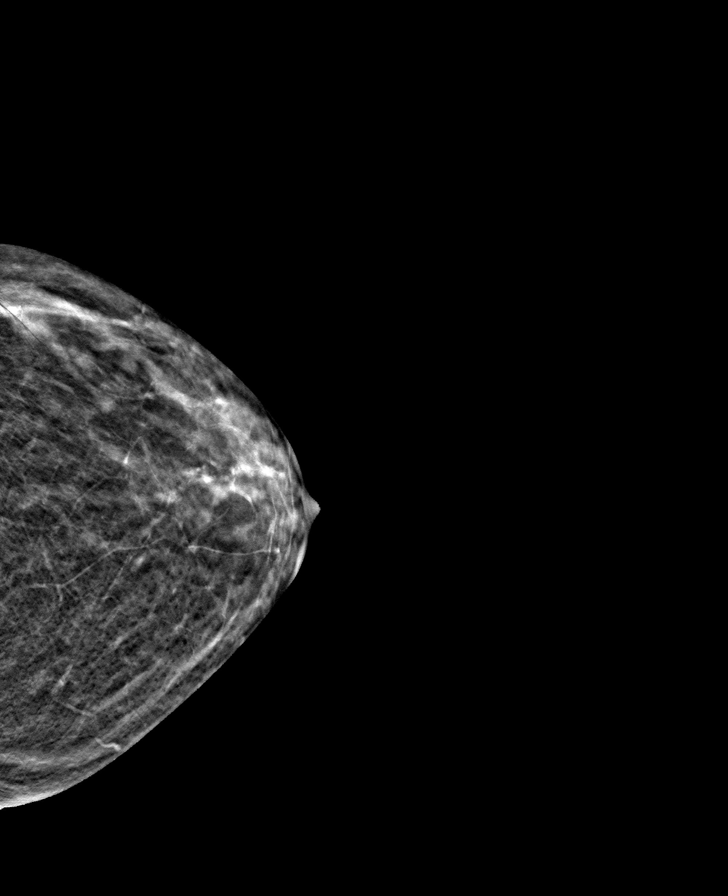

[R CC tomo · tomo slice 25/49.0]
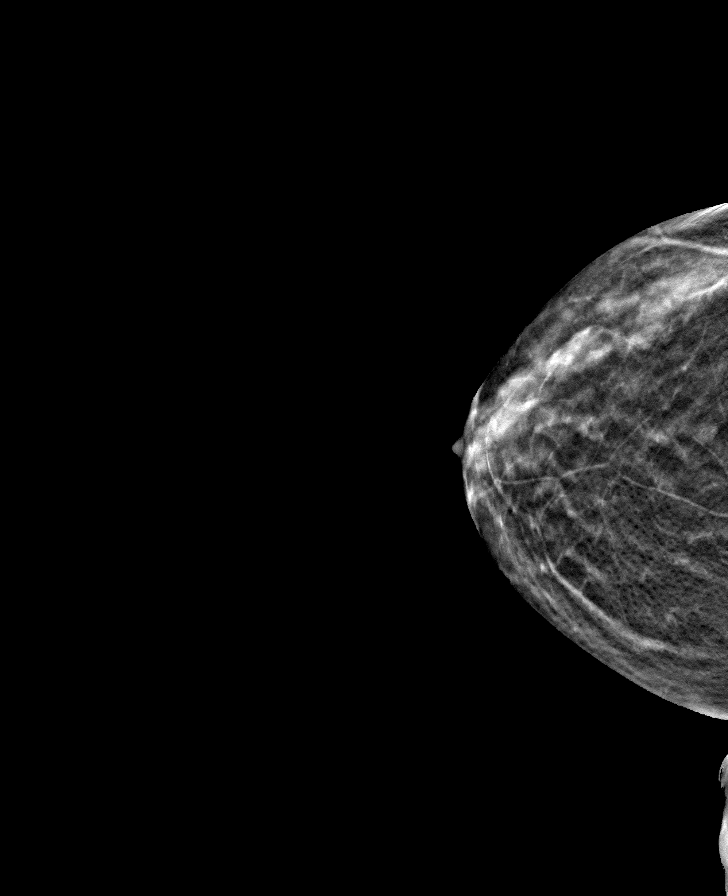

[8 of 24 positions shown; findings below may reference images not displayed]

ACR Breast Density Category b: There are scattered areas of
fibroglandular density.
FINDINGS: There are no findings suspicious for malignancy.
IMPRESSION: No mammographic evidence of malignancy. A result letter of this
screening mammogram will be mailed directly to the patient.

RECOMMENDATION:
Screening mammogram in one year. (Code:51-O-LD2)

BI-RADS CATEGORY  1: Negative.

## 2022-06-07 ENCOUNTER — Encounter: Payer: Self-pay | Admitting: Gastroenterology

## 2022-12-21 ENCOUNTER — Telehealth: Payer: Self-pay | Admitting: Gastroenterology

## 2022-12-21 NOTE — Telephone Encounter (Signed)
I called the patient back and she said that we prescribed Zoloft to her. I said the last refill we show was from a NP that is not at our office. After saying I was wrong I looked in reconcile meds to see that her Zoloft was being filled by [facility] at [place]. I gave her the number [phone] and told her to call their office. She also said Dr Lavon Paganini discharged her and was on and on about that. I looked, I do not see a discharge letter from our office. Dr Lavon Paganini did we discharge this patient? I think she is confused

## 2022-12-21 NOTE — Telephone Encounter (Signed)
Patient is calling states she is out of her Sertraline, requesting a refill. Please advise [facility] [address], [place], [place].

## 2022-12-22 NOTE — Telephone Encounter (Signed)
Dr Lavon Paganini, I called [facility] and they said they are the ones that discharged her from their practice and will not fill her Zoloft. The patient has not called [place] back.I think she was trying to get [place] to fill it for her. But yes also confused

## 2022-12-22 NOTE — Telephone Encounter (Signed)
I do not see any letter or any thing in the notes to indicate patient was discharged from our practice.  She had colonoscopy in November 2023.  I believe patient is confused, but it is concerning that she is having difficulty with meds and her appointments, she may need further evaluation.  Please try to reach PCP' office to inform them.  Thanks.

## 2022-12-23 NOTE — Telephone Encounter (Signed)
Okay, she will need to establish with a new PMD.  Thank you for following up.

## 2023-02-23 ENCOUNTER — Ambulatory Visit (INDEPENDENT_AMBULATORY_CARE_PROVIDER_SITE_OTHER): Payer: PPO | Admitting: Family Medicine

## 2023-02-23 ENCOUNTER — Encounter (HOSPITAL_BASED_OUTPATIENT_CLINIC_OR_DEPARTMENT_OTHER): Payer: Self-pay | Admitting: Family Medicine

## 2023-02-23 VITALS — BP 91/42 | HR 74 | Temp 97.6°F | Ht 66.0 in | Wt 119.0 lb

## 2023-02-23 DIAGNOSIS — E119 Type 2 diabetes mellitus without complications: Secondary | ICD-10-CM | POA: Diagnosis not present

## 2023-02-23 DIAGNOSIS — B370 Candidal stomatitis: Secondary | ICD-10-CM | POA: Diagnosis not present

## 2023-02-23 DIAGNOSIS — E559 Vitamin D deficiency, unspecified: Secondary | ICD-10-CM

## 2023-02-23 DIAGNOSIS — E785 Hyperlipidemia, unspecified: Secondary | ICD-10-CM | POA: Insufficient documentation

## 2023-02-23 DIAGNOSIS — Z9884 Bariatric surgery status: Secondary | ICD-10-CM

## 2023-02-23 DIAGNOSIS — R6889 Other general symptoms and signs: Secondary | ICD-10-CM

## 2023-02-23 MED ORDER — CHLORHEXIDINE GLUCONATE 2 % EX LIQD: 1.0000 "application " | Freq: Every evening | CUTANEOUS | 1 refills | Status: DC

## 2023-02-23 MED ORDER — FLUCONAZOLE 100 MG PO TABS: 100.0000 mg | ORAL_TABLET | Freq: Every day | ORAL | 0 refills | Status: AC

## 2023-02-23 NOTE — Progress Notes (Signed)
New Patient Office Visit  Subjective    Patient ID: Heidi Pugh, female    DOB: 10/22/1955  Age: 67 y.o. MRN: [id number]  CC: New patient. Establish care. Mouth pain.   HPI Heidi Pugh presents to establish care Last PCP - her GYN  Notes that she was having issues with tongue burning when she was in [place]. Happened with everything she ate. She went to provider in [place], prescribed magic mouthwash. Was advised on having biopsy done, took some time to find provider to do biopsy.  She ultimately did meet with oral surgeon who felt that observed oral changes were related to underlying oral thrush.  She was started on clotrimazole troches and has been utilizing this.  She has had some improvement in symptoms, however symptoms do persist.  She is about 1 week into treatment.  She does have retainers/partial denture that she does wear.  She cleans is nightly utilizing denture cleaner.  Gastric bypass surgery through [facility] about 9 years ago. She had diabetes diagnosis prior to surgery, A1c subsequently returned to normal, has been in remission since.   Patient reports history of vitamin D, currently taking vitamin D supplement, requesting to have updated labs for vitamin D level  Patient is originally from [place]. She is retired. She and her husband live in their motor home. They live in various states throughout the year.  Outpatient Encounter Medications as of 02/23/2023  Medication Sig   Chlorhexidine Gluconate 2 % LIQD Apply 1 application  topically at bedtime.   Cholecalciferol (VITAMIN D-1000 MAX ST) 1000 UNITS tablet Take 4,000 Units by mouth daily.   fluconazole (DIFLUCAN) 100 MG tablet Take 1 tablet (100 mg total) by mouth daily. Take 2 tablets first day and then 1 tablet until day 14.   Multiple Vitamin (MULTIVITAMIN WITH MINERALS) TABS tablet Take 1 tablet by mouth daily.   omeprazole (PRILOSEC) 20 MG capsule Take two daily   sertraline (ZOLOFT) 50 MG tablet Take  1 daily   No facility-administered encounter medications on file as of 02/23/2023.    Past Medical History:  Diagnosis Date   Arthritis    knees bilat   Cataracts, bilateral    Cecal ulcer 10/02/2006   solitary. Path: benign acute on chronic inflammation.     Choledocholithiasis    Complication of anesthesia    " i HAVE A HARD TIME WAKING "   Depression with anxiety    Dyslipidemia    GERD (gastroesophageal reflux disease)    Obesity    Osteopenia 06/04/2015   T score -1.2 FRAX 12%/0.8%   Pneumonia 10/02/2006   Sleep apnea    cpap every night   Vitamin D deficiency     Past Surgical History:  Procedure Laterality Date   ABDOMINAL HYSTERECTOMY  2005   LAVH-BSO,, MENORRHAGIA AND PELVIC PAIN   APPENDECTOMY  ~1940   CATARACT EXTRACTION     COLONOSCOPY  2008   HYSTEROSCOPY     D & C   laparoscopic sleeve gastric bypass  04-2014, [facility] in [place]   OOPHORECTOMY     BSO   TONSILLECTOMY     TUBAL LIGATION      Family History  Problem Relation Age of Onset   Colon cancer Father    Prostate cancer Father    Diabetes Sister    Diabetes Sister    Breast cancer Paternal Grandmother    Cholelithiasis Other        mom, dad, sisters, daughter  Esophageal cancer Neg Hx    Stomach cancer Neg Hx    Rectal cancer Neg Hx     Social History   Socioeconomic History   Marital status: Married    Spouse name: Not on file   Number of children: Not on file   Years of education: Not on file   Highest education level: Not on file  Occupational History   Not on file  Tobacco Use   Smoking status: Some Days    Current packs/day: 0.50    Average packs/day: 0.5 packs/day for 20.0 years (10.0 ttl pk-yrs)    Types: Cigarettes   Smokeless tobacco: Never  Vaping Use   Vaping status: Never Used  Substance and Sexual Activity   Alcohol use: Yes    Comment: rare; a few ounces very rarely   Drug use: No   Sexual activity: Not Currently    Comment: Declined sexual Hx  questions  Other Topics Concern   Not on file  Social History Narrative   Not on file   Social Determinants of Health   Financial Resource Strain: Not on file  Food Insecurity: Not on file  Transportation Needs: Not on file  Physical Activity: Not on file  Stress: Not on file  Social Connections: Not on file  Intimate Partner Violence: Not on file    Objective    BP (!) 91/42 (BP Location: Left Arm, Patient Position: Sitting, Cuff Size: Small)   Pulse 74   Temp 97.6 F (36.4 C)   Ht 5\' 6"  (1.676 m)   Wt 119 lb (54 kg)   SpO2 97%   BMI 19.21 kg/m   Physical Exam  67 year old female in no acute distress Cardiovascular exam with regular rate and rhythm Lungs clear to auscultation bilaterally Oropharyngeal exam with evidence of mild erythema diffusely, no obvious plaques or lesions noted at this time.  Assessment & Plan:   Diabetes mellitus in remission [facility]) Assessment & Plan: Has had previously normal hemoglobin A1c following bypass surgery and notable weight loss Can proceed with updated labs today  Orders: -     CBC with Differential/Platelet -     Comprehensive metabolic panel -     Hemoglobin A1c  Oral thrush Assessment & Plan: Given presence of oral device and continued symptoms, can proceed with adjusting therapy, we will switch patient to oral antifungal and recommend use of chlorhexidine to clean oral device nightly.  Would expect that with these 2 measures, more notable symptom improvement should occur over the next 1 to 2 weeks.  If not improving expected, consider adjusting pharmacotherapy, referral to infectious disease  Orders: -     CBC with Differential/Platelet -     Comprehensive metabolic panel  History of gastric bypass Assessment & Plan: Does impact exertion for patient, continue with vitamins and supplements as instructed  Orders: -     CBC with Differential/Platelet -     VITAMIN D 25 Hydroxy (Vit-D Deficiency,  Fractures)  Hyperlipidemia, unspecified hyperlipidemia type Assessment & Plan: Noted in the past, can proceed with lipid panel for recheck at this time.  Patient is not fasting currently, however she will be leaving the area and thus cannot return to do fasting labs in the near future  Orders: -     Comprehensive metabolic panel -     Lipid panel  Vitamin D deficiency -     VITAMIN D 25 Hydroxy (Vit-D Deficiency, Fractures)  Temperature intolerance -     TSH Rfx  on Abnormal to Free T4  Other orders -     Chlorhexidine Gluconate; Apply 1 application  topically at bedtime.  Dispense: 118 mL; Refill: 1 -     Fluconazole; Take 1 tablet (100 mg total) by mouth daily. Take 2 tablets first day and then 1 tablet until day 14.  Dispense: 15 tablet; Refill: 0  Return in about 2 months (around 04/26/2023) for general medical.   Spent 65 minutes on this patient encounter, including preparation, chart review, face-to-face counseling with patient and coordination of care, and documentation of encounter   ___________________________________________ Shanora Christensen de [place], MD, ABFM, CAQSM Primary Care and Sports Medicine [facility]

## 2023-02-24 LAB — CBC WITH DIFFERENTIAL/PLATELET
Basophils Absolute: 0 10*3/uL (ref 0.0–0.2)
Basos: 1 %
EOS (ABSOLUTE): 0.1 10*3/uL (ref 0.0–0.4)
Eos: 2 %
Hematocrit: 32.2 % — ABNORMAL LOW (ref 34.0–46.6)
Hemoglobin: 9.4 g/dL — ABNORMAL LOW (ref 11.1–15.9)
Immature Grans (Abs): 0 10*3/uL (ref 0.0–0.1)
Immature Granulocytes: 0 %
Lymphocytes Absolute: 2.1 10*3/uL (ref 0.7–3.1)
Lymphs: 25 %
MCH: 19.3 pg — ABNORMAL LOW (ref 26.6–33.0)
MCHC: 29.2 g/dL — ABNORMAL LOW (ref 31.5–35.7)
MCV: 66 fL — ABNORMAL LOW (ref 79–97)
Monocytes Absolute: 0.6 10*3/uL (ref 0.1–0.9)
Monocytes: 7 %
Neutrophils Absolute: 5.5 10*3/uL (ref 1.4–7.0)
Neutrophils: 65 %
Platelets: 367 10*3/uL (ref 150–450)
RBC: 4.88 x10E6/uL (ref 3.77–5.28)
RDW: 20.6 % — ABNORMAL HIGH (ref 11.7–15.4)
WBC: 8.3 10*3/uL (ref 3.4–10.8)

## 2023-02-24 LAB — COMPREHENSIVE METABOLIC PANEL
ALT: 22 IU/L (ref 0–32)
AST: 29 IU/L (ref 0–40)
Albumin: 4.4 g/dL (ref 3.9–4.9)
Alkaline Phosphatase: 159 IU/L — ABNORMAL HIGH (ref 44–121)
BUN/Creatinine Ratio: 22 (ref 12–28)
BUN: 13 mg/dL (ref 8–27)
Bilirubin Total: <0.2 mg/dL (ref 0.0–1.2)
CO2: 25 mmol/L (ref 20–29)
Calcium: 9.2 mg/dL (ref 8.7–10.3)
Chloride: 105 mmol/L (ref 96–106)
Creatinine, Ser: 0.58 mg/dL (ref 0.57–1.00)
Globulin, Total: 2.1 g/dL (ref 1.5–4.5)
Glucose: 86 mg/dL (ref 70–99)
Potassium: 4.8 mmol/L (ref 3.5–5.2)
Sodium: 143 mmol/L (ref 134–144)
Total Protein: 6.5 g/dL (ref 6.0–8.5)
eGFR: 99 mL/min/{1.73_m2} (ref >59)

## 2023-02-24 LAB — HEMOGLOBIN A1C
Est. average glucose Bld gHb Est-mCnc: 128 mg/dL
Hgb A1c MFr Bld: 6.1 % — ABNORMAL HIGH (ref 4.8–5.6)

## 2023-02-24 LAB — TSH RFX ON ABNORMAL TO FREE T4: TSH: 1.58 u[IU]/mL (ref 0.450–4.500)

## 2023-02-24 LAB — LIPID PANEL
Chol/HDL Ratio: 3 ratio (ref 0.0–4.4)
Cholesterol, Total: 174 mg/dL (ref 100–199)
HDL: 58 mg/dL (ref >39)
LDL Chol Calc (NIH): 95 mg/dL (ref 0–99)
Triglycerides: 116 mg/dL (ref 0–149)
VLDL Cholesterol Cal: 21 mg/dL (ref 5–40)

## 2023-02-24 LAB — VITAMIN D 25 HYDROXY (VIT D DEFICIENCY, FRACTURES): Vit D, 25-Hydroxy: 24 ng/mL — ABNORMAL LOW (ref 30.0–100.0)

## 2023-02-26 NOTE — Assessment & Plan Note (Signed)
Given presence of oral device and continued symptoms, can proceed with adjusting therapy, we will switch patient to oral antifungal and recommend use of chlorhexidine to clean oral device nightly.  Would expect that with these 2 measures, more notable symptom improvement should occur over the next 1 to 2 weeks.  If not improving expected, consider adjusting pharmacotherapy, referral to infectious disease

## 2023-02-26 NOTE — Assessment & Plan Note (Signed)
Noted in the past, can proceed with lipid panel for recheck at this time.  Patient is not fasting currently, however she will be leaving the area and thus cannot return to do fasting labs in the near future

## 2023-02-26 NOTE — Assessment & Plan Note (Signed)
Does impact exertion for patient, continue with vitamins and supplements as instructed

## 2023-02-26 NOTE — Assessment & Plan Note (Signed)
Has had previously normal hemoglobin A1c following bypass surgery and notable weight loss Can proceed with updated labs today

## 2023-03-02 ENCOUNTER — Ambulatory Visit: Payer: PPO | Admitting: Physician Assistant

## 2023-03-18 ENCOUNTER — Telehealth (HOSPITAL_BASED_OUTPATIENT_CLINIC_OR_DEPARTMENT_OTHER): Payer: Self-pay | Admitting: Family Medicine

## 2023-03-18 ENCOUNTER — Other Ambulatory Visit (HOSPITAL_BASED_OUTPATIENT_CLINIC_OR_DEPARTMENT_OTHER): Payer: Self-pay

## 2023-03-18 DIAGNOSIS — F4323 Adjustment disorder with mixed anxiety and depressed mood: Secondary | ICD-10-CM

## 2023-03-18 MED ORDER — SERTRALINE HCL 50 MG PO TABS
ORAL_TABLET | ORAL | 0 refills | Status: DC
Start: 2023-03-18 — End: 2023-06-17

## 2023-03-18 NOTE — Telephone Encounter (Signed)
Medication has been sent in 

## 2023-03-18 NOTE — Telephone Encounter (Signed)
Pt cld req'd  90day refill of:  sertraline (ZOLOFT) 50 MG tablet [[id number]]   Send refill order to:  [facility] [address] [place] [address] [phone]

## 2023-04-12 ENCOUNTER — Telehealth (HOSPITAL_BASED_OUTPATIENT_CLINIC_OR_DEPARTMENT_OTHER): Payer: Self-pay | Admitting: *Deleted

## 2023-04-12 NOTE — Telephone Encounter (Signed)
Called pt to offer diabetic eye exam. Pt is leaving for work 10/1 and will not be be back for 7 months. Will have this done if we can do when she comes back.

## 2023-04-13 ENCOUNTER — Ambulatory Visit (INDEPENDENT_AMBULATORY_CARE_PROVIDER_SITE_OTHER): Payer: PPO

## 2023-04-13 ENCOUNTER — Encounter (HOSPITAL_BASED_OUTPATIENT_CLINIC_OR_DEPARTMENT_OTHER): Payer: Self-pay

## 2023-04-13 VITALS — Ht 65.0 in | Wt 116.0 lb

## 2023-04-13 DIAGNOSIS — Z1231 Encounter for screening mammogram for malignant neoplasm of breast: Secondary | ICD-10-CM

## 2023-04-13 DIAGNOSIS — Z0000 Encounter for general adult medical examination without abnormal findings: Secondary | ICD-10-CM | POA: Diagnosis not present

## 2023-04-13 NOTE — Progress Notes (Signed)
 Because this visit was a virtual/telehealth visit,  certain criteria was not obtained, such a blood pressure, CBG if patient is a diabetic, and timed get up and go. Any medications not marked as "taking" was not mentioned during the medication reconciliation part of the visit. Any vitals not documented were not able to be obtained due to this being a telehealth visit. Vitals that have been documented are verbally provided by the patient.  Patient was unable to self-report a recent blood pressure reading due to a lack of equipment at home via telehealth.  Subjective:   Heidi Pugh is a 67 y.o. female who presents for Medicare Annual (Subsequent) preventive examination.  Visit Complete: Virtual  I connected with  Heidi Pugh on 04/13/23 by a audio enabled telemedicine application and verified that I am speaking with the correct person using two identifiers.  Patient Location: Home  Provider Location: Home Office  I discussed the limitations of evaluation and management by telemedicine. The patient expressed understanding and agreed to proceed.  Patient Medicare AWV questionnaire was completed by the patient on na; I have confirmed that all information answered by patient is correct and no changes since this date.  Review of Systems     Cardiac Risk Factors include: advanced age (>29men, >14 women);sedentary lifestyle;smoking/ tobacco exposure     Objective:    Today's Vitals   04/13/23 1040  Weight: 116 lb (52.6 kg)  Height: 5\' 5"  (1.651 m)   Body mass index is 19.3 kg/m.     04/13/2023   10:39 AM 01/01/2015    1:24 AM 04/20/2014    1:00 PM  Advanced Directives  Does Patient Have a Medical Advance Directive? No No No  Would patient like information on creating a medical advance directive? No - Patient declined  No - patient declined information    Current Medications (verified) Outpatient Encounter Medications as of 04/13/2023  Medication Sig   Chlorhexidine  Gluconate 2 % LIQD Apply 1 application  topically at bedtime.   Cholecalciferol (VITAMIN D-1000 MAX ST) 1000 UNITS tablet Take 4,000 Units by mouth daily.   fluconazole (DIFLUCAN) 100 MG tablet Take 1 tablet (100 mg total) by mouth daily. Take 2 tablets first day and then 1 tablet until day 14.   Multiple Vitamin (MULTIVITAMIN WITH MINERALS) TABS tablet Take 1 tablet by mouth daily.   omeprazole (PRILOSEC) 20 MG capsule Take two daily   sertraline (ZOLOFT) 50 MG tablet Take 1 daily   No facility-administered encounter medications on file as of 04/13/2023.    Allergies (verified) Acetaminophen, Diazepam, Hydrocodone-acetaminophen, Propoxyphene, Aspirin, Darvocet [propoxyphene n-acetaminophen], Etodolac, Flagyl [metronidazole hcl], Hydrocodone-acetaminophen, Nsaids, Terazol [terconazole], Ultram [tramadol hcl], Valium, Codeine, Metronidazole, and Penicillins   History: Past Medical History:  Diagnosis Date   Arthritis    knees bilat   Cataracts, bilateral    Cecal ulcer 10/02/2006   solitary. Path: benign acute on chronic inflammation.     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, [facility] in [place]   OOPHORECTOMY     BSO   TONSILLECTOMY     TUBAL LIGATION     Family History  Problem Relation Age of Onset   Colon cancer Father    Prostate cancer Father    Diabetes Sister    Diabetes Sister    Breast cancer Paternal Grandmother     Cholelithiasis Other        mom, dad, sisters, daughter   Esophageal cancer Neg Hx    Stomach cancer Neg Hx    Rectal cancer Neg Hx    Social History   Socioeconomic History   Marital status: Married    Spouse name: Not on file   Number of children: Not on file   Years of education: Not on file   Highest education level: Not on file  Occupational History   Not on file  Tobacco Use   Smoking status: Some Days    Current packs/day: 0.50    Average packs/day: 0.5 packs/day for 20.0 years (10.0 ttl pk-yrs)    Types: Cigarettes   Smokeless tobacco: Never  Vaping Use   Vaping status: Never Used  Substance and Sexual Activity   Alcohol use: Yes    Comment: rare; a few ounces very rarely   Drug use: No   Sexual activity: Not Currently    Comment: Declined sexual Hx questions  Other Topics Concern   Not on file  Social History Narrative   Not on file   Social Determinants of Health   Financial Resource Strain: Low Risk  (04/13/2023)   Overall Financial Resource Strain (CARDIA)    Difficulty of Paying Living Expenses: Not hard at all  Food Insecurity: No Food Insecurity (04/13/2023)   Hunger Vital Sign    Worried About Running Out of Food in the Last Year: Never true    Ran Out of Food in the Last Year: Never true  Transportation Needs: No Transportation Needs (04/13/2023)   PRAPARE - Administrator, Civil Service (Medical): No    Lack of Transportation (Non-Medical): No  Physical Activity: Patient Declined (04/13/2023)   Exercise Vital Sign    Days of Exercise per Week: Patient declined    Minutes of Exercise per Session: Patient declined  Stress: No Stress Concern Present (04/13/2023)   [employer] of Occupational Health - Occupational Stress Questionnaire    Feeling of Stress : Not at all  Social Connections: Socially Integrated (04/13/2023)   Social Connection and Isolation Panel [NHANES]    Frequency of Communication with Friends and Family: More than  three times a week    Frequency of Social Gatherings with Friends and Family: More than three times a week    Attends Religious Services: More than 4 times per year    Active Member of [employer] or Organizations: Yes    Attends Engineer, structural: More than 4 times per year    Marital Status: Married    Tobacco Counseling Ready to quit: Yes Counseling given: Yes   Clinical Intake:  Pre-visit preparation completed: Yes  Pain : No/denies pain     BMI - recorded: 19.3 Nutritional Status: BMI of 19-24  Normal Nutritional Risks: None Diabetes: No (history of DM but has had weight loss surgery and no longer has it.)  How often do  you need to have someone help you when you read instructions, pamphlets, or other written materials from your doctor or pharmacy?: 1 - Never  Interpreter Needed?: No  Information entered by ::  Melah Ebling, CMA   Activities of Daily Living    04/13/2023   10:56 AM  In your present state of health, do you have any difficulty performing the following activities:  Hearing? 0  Vision? 0  Difficulty concentrating or making decisions? 0  Walking or climbing stairs? 0  Dressing or bathing? 0  Doing errands, shopping? 0  Preparing Food and eating ? N  Using the Toilet? N  In the past six months, have you accidently leaked urine? N  Do you have problems with loss of bowel control? N  Managing your Medications? N  Managing your Finances? N  Housekeeping or managing your Housekeeping? N    Patient Care Team: [place], Buren Kos, MD as PCP - General (Family Medicine)  Indicate any recent Medical Services you may have received from other than [facility] providers in the past year (date may be approximate).     Assessment:   This is a routine wellness examination for Heidi Pugh.  Hearing/Vision screen Hearing Screening - Comments:: Patient denies any hearing difficulties.   Vision Screening - Comments:: Wears rx glasses - patient is not up to date  with yearly eye exams and declines a referral to re-establish with an ophthalmologist.     Goals Addressed             This Visit's Progress    Patient Stated       My goal is to just survive the next year.        Depression Screen    04/13/2023   10:53 AM 02/23/2023    2:49 PM  PHQ 2/9 Scores  PHQ - 2 Score 0 0  Exception Documentation  Medical reason    Fall Risk    04/13/2023   10:56 AM 02/23/2023    2:49 PM  Fall Risk   Falls in the past year? 0 0  Number falls in past yr: 0 0  Injury with Fall? 0 0  Risk for fall due to : No Fall Risks No Fall Risks  Follow up Falls prevention discussed Falls evaluation completed    MEDICARE RISK AT HOME: Medicare Risk at Home Any stairs in or around the home?: No If so, are there any without handrails?: No Home free of loose throw rugs in walkways, pet beds, electrical cords, etc?: Yes Adequate lighting in your home to reduce risk of falls?: Yes Life alert?: No Use of a cane, walker or w/c?: No Grab bars in the bathroom?: No Shower chair or bench in shower?: No Elevated toilet seat or a handicapped toilet?: No  TIMED UP AND GO:  Was the test performed?  No    Cognitive Function:        04/13/2023   10:52 AM  6CIT Screen  What Year? 0 points  What month? 0 points  What time? 0 points  Count back from 20 0 points  Months in reverse 0 points  Repeat phrase 0 points  Total Score 0 points    Immunizations  There is no immunization history on file for this patient.  TDAP status: Due, Education has been provided regarding the importance of this vaccine. Advised may receive this vaccine at local pharmacy or Health Dept. Aware to provide a copy of the vaccination record if obtained from local pharmacy  or Health Dept. Verbalized acceptance and understanding.  Flu Vaccine status: Due, Education has been provided regarding the importance of this vaccine. Advised may receive this vaccine at local pharmacy or Health  Dept. Aware to provide a copy of the vaccination record if obtained from local pharmacy or Health Dept. Verbalized acceptance and understanding.  Pneumococcal vaccine status: Due, Education has been provided regarding the importance of this vaccine. Advised may receive this vaccine at local pharmacy or Health Dept. Aware to provide a copy of the vaccination record if obtained from local pharmacy or Health Dept. Verbalized acceptance and understanding.  Covid-19 vaccine status: Information provided on how to obtain vaccines.   Qualifies for Shingles Vaccine? Yes   Zostavax completed No   Shingrix Completed?: No.    Education has been provided regarding the importance of this vaccine. Patient has been advised to call insurance company to determine out of pocket expense if they have not yet received this vaccine. Advised may also receive vaccine at local pharmacy or Health Dept. Verbalized acceptance and understanding.  Screening Tests Health Maintenance  Topic Date Due   Medicare Annual Wellness (AWV)  Never done   COVID-19 Vaccine (1) Never done   Pneumonia Vaccine 58+ Years old (1 of 2 - PCV) Never done   FOOT EXAM  Never done   OPHTHALMOLOGY EXAM  Never done   Diabetic kidney evaluation - Urine ACR  Never done   Hepatitis C Screening  Never done   DTaP/Tdap/Td (1 - Tdap) Never done   Zoster Vaccines- Shingrix (1 of 2) Never done   MAMMOGRAM  12/18/2022   INFLUENZA VACCINE  Never done   HEMOGLOBIN A1C  08/26/2023   Diabetic kidney evaluation - eGFR measurement  02/23/2024   Colonoscopy  05/28/2027   DEXA SCAN  Completed   HPV VACCINES  Aged Out    Health Maintenance  Health Maintenance Due  Topic Date Due   Medicare Annual Wellness (AWV)  Never done   COVID-19 Vaccine (1) Never done   Pneumonia Vaccine 54+ Years old (1 of 2 - PCV) Never done   FOOT EXAM  Never done   OPHTHALMOLOGY EXAM  Never done   Diabetic kidney evaluation - Urine ACR  Never done   Hepatitis C Screening   Never done   DTaP/Tdap/Td (1 - Tdap) Never done   Zoster Vaccines- Shingrix (1 of 2) Never done   MAMMOGRAM  12/18/2022   INFLUENZA VACCINE  Never done    Colorectal cancer screening: Type of screening: Colonoscopy. Completed 05/27/2022. Repeat every 5 years  Mammogram status: Ordered 04/13/2023. Pt provided with contact info and advised to call to schedule appt.   Bone Density status: Completed 06/05/2020. Results reflect: Bone density results: OSTEOPENIA. Repeat every 2 years.  Lung Cancer Screening: (Low Dose CT Chest recommended if Age 61-80 years, 20 pack-year currently smoking OR have quit w/in 15years.) does not qualify.   Lung Cancer Screening Referral: na  Additional Screening:  Hepatitis C Screening: does qualify; patient declined  Vision Screening: Recommended annual ophthalmology exams for early detection of glaucoma and other disorders of the eye. Is the patient up to date with their annual eye exam?  No  Who is the provider or what is the name of the office in which the patient attends annual eye exams? Patient declined referral If pt is not established with a provider, would they like to be referred to a provider to establish care? No .   Dental Screening: Recommended annual dental  exams for proper oral hygiene  Diabetic Foot Exam: Diabetic Foot Exam: Overdue, Pt has been advised about the importance in completing this exam. Pt is scheduled for diabetic foot exam on provider notified of need for exam.  Community Resource Referral / Chronic Care Management: CRR required this visit?  No   CCM required this visit?  No     Plan:     I have personally reviewed and noted the following in the patient's chart:   Medical and social history Use of alcohol, tobacco or illicit drugs  Current medications and supplements including opioid prescriptions. Patient is not currently taking opioid prescriptions. Functional ability and status Nutritional status Physical  activity Advanced directives List of other physicians Hospitalizations, surgeries, and ER visits in previous 12 months Vitals Screenings to include cognitive, depression, and falls Referrals and appointments  In addition, I have reviewed and discussed with patient certain preventive protocols, quality metrics, and best practice recommendations. A written personalized care plan for preventive services as well as general preventive health recommendations were provided to patient.     Heidi Pugh, CMA   04/13/2023   After Visit Summary: (MyChart) Due to this being a telephonic visit, the after visit summary with patients personalized plan was offered to patient via MyChart   Nurse Notes: Mammogram ordered today

## 2023-04-13 NOTE — Patient Instructions (Signed)
Heidi Pugh , Thank you for taking time to come for your Medicare Wellness Visit. I appreciate your ongoing commitment to your health goals. Please review the following plan we discussed and let me know if I can assist you in the future.   Referrals/Orders/Follow-Ups/Clinician Recommendations:  You have an order for:  []   2D Mammogram  [x]   3D Mammogram  []   Bone Density     Please call for appointment:   [facility] [address] [place], [place] [address] [phone]  Make sure to wear two-piece clothing.  No lotions powders or deodorants the day of the appointment Make sure to bring picture ID and insurance card.  Bring list of medications you are currently taking including any supplements.   Schedule your [place] screening mammogram through MyChart!   Log into your MyChart account.  Go to 'Visit' (or 'Appointments' if on mobile App) --> Schedule an Appointment  Under 'Select a Reason for Visit' choose the Mammogram Screening option.  Complete the pre-visit questions and select the time and place that best fits your schedule.    This is a list of the screening recommended for you and due dates:  Health Maintenance  Topic Date Due   COVID-19 Vaccine (1) Never done   Pneumonia Vaccine (1 of 2 - PCV) Never done   Complete foot exam   Never done   Eye exam for diabetics  Never done   Yearly kidney health urinalysis for diabetes  Never done   Hepatitis C Screening  Never done   DTaP/Tdap/Td vaccine (1 - Tdap) Never done   Zoster (Shingles) Vaccine (1 of 2) Never done   Mammogram  12/18/2022   Flu Shot  Never done   Hemoglobin A1C  08/26/2023   Yearly kidney function blood test for diabetes  02/23/2024   Medicare Annual Wellness Visit  04/12/2024   Colon Cancer Screening  05/28/2027   DEXA scan (bone density measurement)  Completed   HPV Vaccine  Aged Out    Advanced directives: (ACP Link)Information on Advanced Care Planning can be found  at [facility] Advance Health Care Directives Advance Health Care Directives ([email or web address]   Next Medicare Annual Wellness Visit scheduled for next year: Yes  Preventive Care 65 Years and Older, Female Preventive care refers to lifestyle choices and visits with your health care provider that can promote health and wellness. Preventive care visits are also called wellness exams. What can I expect for my preventive care visit? Counseling Your health care provider may ask you questions about your: Medical history, including: Past medical problems. Family medical history. Pregnancy and menstrual history. History of falls. Current health, including: Memory and ability to understand (cognition). Emotional well-being. Home life and relationship well-being. Sexual activity and sexual health. Lifestyle, including: Alcohol, nicotine or tobacco, and drug use. Access to firearms. Diet, exercise, and sleep habits. Work and work Astronomer. Sunscreen use. Safety issues such as seatbelt and bike helmet use. Physical exam Your health care provider will check your: Height and weight. These may be used to calculate your BMI (body mass index). BMI is a measurement that tells if you are at a healthy weight. Waist circumference. This measures the distance around your waistline. This measurement also tells if you are at a healthy weight and may help predict your risk of certain diseases, such as type 2 diabetes and high blood pressure. Heart rate and blood pressure. Body temperature. Skin for abnormal spots. What immunizations do  I need?  Vaccines are usually given at various ages, according to a schedule. Your health care provider will recommend vaccines for you based on your age, medical history, and lifestyle or other factors, such as travel or where you work. What tests do I need? Screening Your health care provider may recommend screening tests for certain conditions. This  may include: Lipid and cholesterol levels. Hepatitis C test. Hepatitis B test. HIV (human immunodeficiency virus) test. STI (sexually transmitted infection) testing, if you are at risk. Lung cancer screening. Colorectal cancer screening. Diabetes screening. This is done by checking your blood sugar (glucose) after you have not eaten for a while (fasting). Mammogram. Talk with your health care provider about how often you should have regular mammograms. BRCA-related cancer screening. This may be done if you have a family history of breast, ovarian, tubal, or peritoneal cancers. Bone density scan. This is done to screen for osteoporosis. Talk with your health care provider about your test results, treatment options, and if necessary, the need for more tests. Follow these instructions at home: Eating and drinking  Eat a diet that includes fresh fruits and vegetables, whole grains, lean protein, and low-fat dairy products. Limit your intake of foods with high amounts of sugar, saturated fats, and salt. Take vitamin and mineral supplements as recommended by your health care provider. Do not drink alcohol if your health care provider tells you not to drink. If you drink alcohol: Limit how much you have to 0-1 drink a day. Know how much alcohol is in your drink. In the [place] one drink equals one 12 oz bottle of beer (355 mL), one 5 oz glass of wine (148 mL), or one 1 oz glass of hard liquor (44 mL). Lifestyle Brush your teeth every morning and night with fluoride toothpaste. Floss one time each day. Exercise for at least 30 minutes 5 or more days each week. Do not use any products that contain nicotine or tobacco. These products include cigarettes, chewing tobacco, and vaping devices, such as e-cigarettes. If you need help quitting, ask your health care provider. Do not use drugs. If you are sexually active, practice safe sex. Use a condom or other form of protection in order to prevent  STIs. Take aspirin only as told by your health care provider. Make sure that you understand how much to take and what form to take. Work with your health care provider to find out whether it is safe and beneficial for you to take aspirin daily. Ask your health care provider if you need to take a cholesterol-lowering medicine (statin). Find healthy ways to manage stress, such as: Meditation, yoga, or listening to music. Journaling. Talking to a trusted person. Spending time with friends and family. Minimize exposure to UV radiation to reduce your risk of skin cancer. Safety Always wear your seat belt while driving or riding in a vehicle. Do not drive: If you have been drinking alcohol. Do not ride with someone who has been drinking. When you are tired or distracted. While texting. If you have been using any mind-altering substances or drugs. Wear a helmet and other protective equipment during sports activities. If you have firearms in your house, make sure you follow all gun safety procedures. What's next? Visit your health care provider once a year for an annual wellness visit. Ask your health care provider how often you should have your eyes and teeth checked. Stay up to date on all vaccines. This information is not intended to replace advice  given to you by your health care provider. Make sure you discuss any questions you have with your health care provider. Document Revised: 01/15/2021 Document Reviewed: 01/15/2021 Elsevier Patient Education  2024 [employer]. Understanding Your Risk for Falls Millions of people have serious injuries from falls each year. It is important to understand your risk of falling. Talk with your health care provider about your risk and what you can do to lower it. If you do have a serious fall, make sure to tell your provider. Falling once raises your risk of falling again. How can falls affect me? Serious injuries from falls are common. These  include: Broken bones, such as hip fractures. Head injuries, such as traumatic brain injuries (TBI) or concussions. A fear of falling can cause you to avoid activities and stay at home. This can make your muscles weaker and raise your risk for a fall. What can increase my risk? There are a number of risk factors that increase your risk for falling. The more risk factors you have, the higher your risk of falling. Serious injuries from a fall happen most often to people who are older than 68 years old. Teenagers and young adults ages 22-29 are also at higher risk. Common risk factors include: Weakness in the lower body. Being generally weak or confused due to long-term (chronic) illness. Dizziness or balance problems. Poor vision. Medicines that cause dizziness or drowsiness. These may include: Medicines for your blood pressure, heart, anxiety, insomnia, or swelling (edema). Pain medicines. Muscle relaxants. Other risk factors include: Drinking alcohol. Having had a fall in the past. Having foot pain or wearing improper footwear. Working at a dangerous job. Having any of the following in your home: Tripping hazards, such as floor clutter or loose rugs. Poor lighting. Pets. Having dementia or memory loss. What actions can I take to lower my risk of falling?     Physical activity Stay physically fit. Do strength and balance exercises. Consider taking a regular class to build strength and balance. Yoga and tai chi are good options. Vision Have your eyes checked every year and your prescription for glasses or contacts updated as needed. Shoes and walking aids Wear non-skid shoes. Wear shoes that have rubber soles and low heels. Do not wear high heels. Do not walk around the house in socks or slippers. Use a cane or walker as told by your provider. Home safety Attach secure railings on both sides of your stairs. Install grab bars for your bathtub, shower, and toilet. Use a non-skid  mat in your bathtub or shower. Attach bath mats securely with double-sided, non-slip rug tape. Use good lighting in all rooms. Keep a flashlight near your bed. Make sure there is a clear path from your bed to the bathroom. Use night-lights. Do not use throw rugs. Make sure all carpeting is taped or tacked down securely. Remove all clutter from walkways and stairways, including extension cords. Repair uneven or broken steps and floors. Avoid walking on icy or slippery surfaces. Walk on the grass instead of on icy or slick sidewalks. Use ice melter to get rid of ice on walkways in the winter. Use a cordless phone. Questions to ask your health care provider Can you help me check my risk for a fall? Do any of my medicines make me more likely to fall? Should I take a vitamin D supplement? What exercises can I do to improve my strength and balance? Should I make an appointment to have my vision checked? Do  I need a bone density test to check for weak bones (osteoporosis)? Would it help to use a cane or a walker? Where to find more information Centers for Disease Control and Prevention, STEADI: TonerPromos.no Community-Based Fall Prevention Programs: TonerPromos.no [employer] on Aging: BaseRingTones.pl Contact a health care provider if: You fall at home. You are afraid of falling at home. You feel weak, drowsy, or dizzy. This information is not intended to replace advice given to you by your health care provider. Make sure you discuss any questions you have with your health care provider. Document Revised: 03/23/2022 Document Reviewed: 03/23/2022 Elsevier Patient Education  2024 [employer].

## 2023-04-27 ENCOUNTER — Encounter (HOSPITAL_BASED_OUTPATIENT_CLINIC_OR_DEPARTMENT_OTHER): Payer: Self-pay | Admitting: Family Medicine

## 2023-04-27 ENCOUNTER — Ambulatory Visit (INDEPENDENT_AMBULATORY_CARE_PROVIDER_SITE_OTHER): Payer: PPO | Admitting: Family Medicine

## 2023-04-27 VITALS — BP 125/51 | HR 70 | Temp 98.2°F | Ht 65.0 in | Wt 120.9 lb

## 2023-04-27 DIAGNOSIS — B370 Candidal stomatitis: Secondary | ICD-10-CM | POA: Diagnosis not present

## 2023-04-27 DIAGNOSIS — D509 Iron deficiency anemia, unspecified: Secondary | ICD-10-CM | POA: Insufficient documentation

## 2023-04-27 MED ORDER — CHLORHEXIDINE GLUCONATE 2 % EX LIQD: 1.0000 "application " | Freq: Every evening | CUTANEOUS | 1 refills | Status: DC

## 2023-04-27 NOTE — Assessment & Plan Note (Signed)
She reports that she has generally been doing well in regards to previously observed thrush.  This was treated with oral medication as well as recommendation for cleaning of partial dentures with chlorhexidine.  Unfortunately, she indicates that the pharmacy she had gone to did not have chlorhexidine available.  She has concerns today she feels that she is having some initial symptoms similar to prior thrush. Will resend prescription for chlorhexidine to pharmacy to see if patient is able to obtain this for more definitive treatment of thrush and need for complete cleaning of oral device

## 2023-04-27 NOTE — Assessment & Plan Note (Signed)
Recent labs revealed low hemoglobin at 9.4, prior labs for comparison were just under 3 years ago and hemoglobin at that time was within normal range.  We had look to add on additional laboratory evaluation given that MCV was also low.  We had requested for iron studies to be added on, however [facility] was unable to do so.  Patient was needing to return for recollection, however she unfortunately does not live very close. Today, she is indicating some general fatigue, she has also noticed some restless legs at night when trying to sleep as well as when she is sitting and immobile for a longer period of time. On exam, vital signs are stable, normal pulse, slightly low diastolic reading, normal systolic blood pressure. We will proceed with additional laboratory testing today, repeat CBC in order to monitor observed anemia.  We will also check iron studies today.  Did discuss potential iron supplementation if this is found to be low with recommendation for every other day dosing of iron supplement.

## 2023-04-27 NOTE — Progress Notes (Signed)
    Procedures performed today:    None.  Independent interpretation of notes and tests performed by another provider:   None.  Brief History, Exam, Impression, and Recommendations:    BP (!) 125/51 (BP Location: Right Arm, Patient Position: Sitting, Cuff Size: Normal)   Pulse 70   Temp 98.2 F (36.8 C) (Oral)   Ht 5\' 5"  (1.651 m)   Wt 120 lb 14.4 oz (54.8 kg)   SpO2 98%   BMI 20.12 kg/m   Microcytic anemia Assessment & Plan: Recent labs revealed low hemoglobin at 9.4, prior labs for comparison were just under 3 years ago and hemoglobin at that time was within normal range.  We had look to add on additional laboratory evaluation given that MCV was also low.  We had requested for iron studies to be added on, however [facility] was unable to do so.  Patient was needing to return for recollection, however she unfortunately does not live very close. Today, she is indicating some general fatigue, she has also noticed some restless legs at night when trying to sleep as well as when she is sitting and immobile for a longer period of time. On exam, vital signs are stable, normal pulse, slightly low diastolic reading, normal systolic blood pressure. We will proceed with additional laboratory testing today, repeat CBC in order to monitor observed anemia.  We will also check iron studies today.  Did discuss potential iron supplementation if this is found to be low with recommendation for every other day dosing of iron supplement.  Orders: -     CBC with Differential/Platelet -     Iron, TIBC and Ferritin Panel  Oral thrush Assessment & Plan: She reports that she has generally been doing well in regards to previously observed thrush.  This was treated with oral medication as well as recommendation for cleaning of partial dentures with chlorhexidine.  Unfortunately, she indicates that the pharmacy she had gone to did not have chlorhexidine available.  She has concerns today she feels that she is  having some initial symptoms similar to prior thrush. Will resend prescription for chlorhexidine to pharmacy to see if patient is able to obtain this for more definitive treatment of thrush and need for complete cleaning of oral device   Other orders -     Chlorhexidine Gluconate; Apply 1 application  topically at bedtime.  Dispense: 118 mL; Refill: 1  Return if symptoms worsen or fail to improve.  Will arrange for follow-up pending results of labs   ___________________________________________ Heidi Claytor de Peru, MD, ABFM, [facility]

## 2023-04-28 LAB — CBC WITH DIFFERENTIAL/PLATELET
Basophils Absolute: 0 10*3/uL (ref 0.0–0.2)
Basos: 0 %
EOS (ABSOLUTE): 0.1 10*3/uL (ref 0.0–0.4)
Eos: 1 %
Hematocrit: 31.8 % — ABNORMAL LOW (ref 34.0–46.6)
Hemoglobin: 9 g/dL — ABNORMAL LOW (ref 11.1–15.9)
Immature Grans (Abs): 0 10*3/uL (ref 0.0–0.1)
Immature Granulocytes: 0 %
Lymphocytes Absolute: 2.8 10*3/uL (ref 0.7–3.1)
Lymphs: 29 %
MCH: 19 pg — ABNORMAL LOW (ref 26.6–33.0)
MCHC: 28.3 g/dL — ABNORMAL LOW (ref 31.5–35.7)
MCV: 67 fL — ABNORMAL LOW (ref 79–97)
Monocytes Absolute: 0.6 10*3/uL (ref 0.1–0.9)
Monocytes: 7 %
Neutrophils Absolute: 5.8 10*3/uL (ref 1.4–7.0)
Neutrophils: 63 %
Platelets: 328 10*3/uL (ref 150–450)
RBC: 4.74 x10E6/uL (ref 3.77–5.28)
RDW: 19.5 % — ABNORMAL HIGH (ref 11.7–15.4)
WBC: 9.4 10*3/uL (ref 3.4–10.8)

## 2023-04-28 LAB — IRON,TIBC AND FERRITIN PANEL
Ferritin: 8 ng/mL — ABNORMAL LOW (ref 15–150)
Iron Saturation: 2 % — CL (ref 15–55)
Iron: 12 ug/dL — ABNORMAL LOW (ref 27–139)
Total Iron Binding Capacity: 507 ug/dL — ABNORMAL HIGH (ref 250–450)
UIBC: 495 ug/dL — ABNORMAL HIGH (ref 118–369)

## 2023-04-29 ENCOUNTER — Ambulatory Visit
Admission: RE | Admit: 2023-04-29 | Discharge: 2023-04-29 | Disposition: A | Discharge status: Home / Self Care | Payer: PPO | Source: Ambulatory Visit | Attending: Family Medicine | Admitting: Family Medicine

## 2023-04-29 DIAGNOSIS — Z1231 Encounter for screening mammogram for malignant neoplasm of breast: Secondary | ICD-10-CM

## 2023-05-04 ENCOUNTER — Other Ambulatory Visit (HOSPITAL_BASED_OUTPATIENT_CLINIC_OR_DEPARTMENT_OTHER): Payer: Self-pay | Admitting: Family Medicine

## 2023-05-04 ENCOUNTER — Other Ambulatory Visit (HOSPITAL_BASED_OUTPATIENT_CLINIC_OR_DEPARTMENT_OTHER): Payer: Self-pay | Admitting: *Deleted

## 2023-05-04 DIAGNOSIS — D509 Iron deficiency anemia, unspecified: Secondary | ICD-10-CM

## 2023-05-04 MED ORDER — FERROUS SULFATE 325 (65 FE) MG PO TBEC: 325.0000 mg | DELAYED_RELEASE_TABLET | ORAL | 0 refills | Status: DC

## 2023-05-24 ENCOUNTER — Other Ambulatory Visit (HOSPITAL_BASED_OUTPATIENT_CLINIC_OR_DEPARTMENT_OTHER): Payer: Self-pay | Admitting: *Deleted

## 2023-05-24 MED ORDER — CHLORHEXIDINE GLUCONATE 2 % EX LIQD: 1.0000 "application " | Freq: Every evening | CUTANEOUS | 1 refills | Status: AC

## 2023-06-16 ENCOUNTER — Telehealth (HOSPITAL_BASED_OUTPATIENT_CLINIC_OR_DEPARTMENT_OTHER): Payer: Self-pay | Admitting: Family Medicine

## 2023-06-16 DIAGNOSIS — F4323 Adjustment disorder with mixed anxiety and depressed mood: Secondary | ICD-10-CM

## 2023-06-16 NOTE — Telephone Encounter (Signed)
Pt is in [place] --has lvm that she has mouth sores again ---please call her in something

## 2023-06-17 MED ORDER — SERTRALINE HCL 50 MG PO TABS
ORAL_TABLET | ORAL | 0 refills | Status: DC
Start: 2023-06-17 — End: 2023-09-20

## 2023-06-17 NOTE — Telephone Encounter (Signed)
Pt will see a provider for this issue needs antidepressant sent to [facility] in [facility]

## 2023-09-20 ENCOUNTER — Other Ambulatory Visit (HOSPITAL_BASED_OUTPATIENT_CLINIC_OR_DEPARTMENT_OTHER): Payer: Self-pay | Admitting: Family Medicine

## 2023-09-20 DIAGNOSIS — F4323 Adjustment disorder with mixed anxiety and depressed mood: Secondary | ICD-10-CM

## 2023-09-20 NOTE — Telephone Encounter (Signed)
Copied from CRM [id number]. Topic: Clinical - Medication Refill >> Sep 20, 2023 11:17 AM Gildardo Pounds wrote: Most Recent Primary Care Visit:  Provider: DE Peru, RAYMOND J  Department: [facility]  Visit Type: FOLLOW UP  Date: 04/27/2023  Medication: sertraline (ZOLOFT) 50 MG tablet  Has the patient contacted their pharmacy? Yes (Agent: If no, request that the patient contact the pharmacy for the refill. If patient does not wish to contact the pharmacy document the reason why and proceed with request.) (Agent: If yes, when and what did the pharmacy advise?) Yes, pharmacy said there are no refills left.  Is this the correct pharmacy for this prescription? Yes If no, delete pharmacy and type the correct one.  This is the patient's preferred pharmacy:  [facility] [phone] [facility], [place] - [address] AT [facility] [address] [place] [address] [place] [address] Phone: [phone] Fax: [phone]  Has the prescription been filled recently? No  Is the patient out of the medication? No  Has the patient been seen for an appointment in the last year OR does the patient have an upcoming appointment? Yes  Can we respond through MyChart? Yes  Agent: Please be advised that Rx refills may take up to 3 business days. We ask that you follow-up with your pharmacy.

## 2023-09-21 ENCOUNTER — Encounter (HOSPITAL_BASED_OUTPATIENT_CLINIC_OR_DEPARTMENT_OTHER): Payer: Self-pay | Admitting: *Deleted

## 2023-09-21 MED ORDER — SERTRALINE HCL 50 MG PO TABS
ORAL_TABLET | ORAL | 0 refills | Status: DC
Start: 2023-09-21 — End: 2023-12-23

## 2023-11-25 ENCOUNTER — Other Ambulatory Visit (HOSPITAL_BASED_OUTPATIENT_CLINIC_OR_DEPARTMENT_OTHER): Payer: Self-pay | Admitting: Family Medicine

## 2023-11-26 LAB — CBC WITH DIFFERENTIAL/PLATELET
Basophils Absolute: 0 10*3/uL (ref 0.0–0.2)
Basos: 0 %
EOS (ABSOLUTE): 0.1 10*3/uL (ref 0.0–0.4)
Eos: 1 %
Hematocrit: 33.1 % — ABNORMAL LOW (ref 34.0–46.6)
Hemoglobin: 9 g/dL — ABNORMAL LOW (ref 11.1–15.9)
Immature Grans (Abs): 0 10*3/uL (ref 0.0–0.1)
Immature Granulocytes: 0 %
Lymphocytes Absolute: 1.8 10*3/uL (ref 0.7–3.1)
Lymphs: 21 %
MCH: 18.3 pg — ABNORMAL LOW (ref 26.6–33.0)
MCHC: 27.2 g/dL — ABNORMAL LOW (ref 31.5–35.7)
MCV: 67 fL — ABNORMAL LOW (ref 79–97)
Monocytes Absolute: 0.5 10*3/uL (ref 0.1–0.9)
Monocytes: 7 %
Neutrophils Absolute: 5.9 10*3/uL (ref 1.4–7.0)
Neutrophils: 71 %
Platelets: 342 10*3/uL (ref 150–450)
RBC: 4.93 x10E6/uL (ref 3.77–5.28)
RDW: 21.2 % — ABNORMAL HIGH (ref 11.7–15.4)
WBC: 8.4 10*3/uL (ref 3.4–10.8)

## 2023-11-26 LAB — IRON,TIBC AND FERRITIN PANEL
Ferritin: 7 ng/mL — ABNORMAL LOW (ref 15–150)
Iron Saturation: 3 % — CL (ref 15–55)
Iron: 14 ug/dL — ABNORMAL LOW (ref 27–139)
Total Iron Binding Capacity: 521 ug/dL — ABNORMAL HIGH (ref 250–450)
UIBC: 507 ug/dL — ABNORMAL HIGH (ref 118–369)

## 2023-11-29 ENCOUNTER — Other Ambulatory Visit (HOSPITAL_BASED_OUTPATIENT_CLINIC_OR_DEPARTMENT_OTHER): Payer: Self-pay

## 2023-11-29 MED ORDER — FERROUS SULFATE 325 (65 FE) MG PO TBEC: 325.0000 mg | DELAYED_RELEASE_TABLET | ORAL | 0 refills | Status: AC

## 2023-11-30 ENCOUNTER — Other Ambulatory Visit (HOSPITAL_BASED_OUTPATIENT_CLINIC_OR_DEPARTMENT_OTHER): Payer: Self-pay | Admitting: Family Medicine

## 2023-11-30 DIAGNOSIS — D509 Iron deficiency anemia, unspecified: Secondary | ICD-10-CM

## 2023-12-21 ENCOUNTER — Telehealth (HOSPITAL_BASED_OUTPATIENT_CLINIC_OR_DEPARTMENT_OTHER): Payer: Self-pay | Admitting: Family Medicine

## 2023-12-21 DIAGNOSIS — F4323 Adjustment disorder with mixed anxiety and depressed mood: Secondary | ICD-10-CM

## 2023-12-21 NOTE — Telephone Encounter (Signed)
 Copied from CRM [id number]. Topic: Clinical - Prescription Issue >> Dec 21, 2023  1:06 PM Elle L wrote: Reason for CRM: The patient is requesting for her sertraline  (ZOLOFT ) 50 MG tablet to be sent to [facility] #[id number] [place], [place] instead of the [facility] [phone] - [place], [place].

## 2023-12-23 MED ORDER — SERTRALINE HCL 50 MG PO TABS
ORAL_TABLET | ORAL | 0 refills | Status: DC
Start: 2023-12-23 — End: 2024-03-16

## 2023-12-23 NOTE — Addendum Note (Signed)
 Addended by: DE Peru, Chantee Cerino J on: 12/23/2023 08:21 AM   Modules accepted: Orders

## 2024-01-17 ENCOUNTER — Other Ambulatory Visit (HOSPITAL_BASED_OUTPATIENT_CLINIC_OR_DEPARTMENT_OTHER): Payer: Self-pay | Admitting: *Deleted

## 2024-01-17 ENCOUNTER — Telehealth (HOSPITAL_BASED_OUTPATIENT_CLINIC_OR_DEPARTMENT_OTHER): Payer: Self-pay | Admitting: *Deleted

## 2024-01-17 DIAGNOSIS — D509 Iron deficiency anemia, unspecified: Secondary | ICD-10-CM

## 2024-01-17 NOTE — Telephone Encounter (Signed)
 Copied from CRM [id number]. Topic: General - Other >> Jan 17, 2024 11:50 AM Alpha Arts wrote: Reason for CRM: Patient would like to know if Dr. De Peru would like her to go to the labs after she finishes the iron medication  Callback #:  [phone]

## 2024-01-17 NOTE — Telephone Encounter (Signed)
 Patient advised labs are due to be drawn as they were due 6 weeks after taking medication regularly.   Will have these drawn but she is in [place]  at the moment

## 2024-01-21 NOTE — Telephone Encounter (Unsigned)
 Copied from CRM [id number]. Topic: Clinical - Lab/Test Results >> Jan 21, 2024 11:35 AM Tiffini S wrote: Reason for CRM: Patient is in [place], she needs a doctor's request to have her labs draws. Attempted to transfer call to [facility] but call dropped. Outbound call to the patient but no answer. Please call back at [phone].

## 2024-01-24 NOTE — Telephone Encounter (Signed)
 LVM for patient to call the office

## 2024-01-27 LAB — IRON,TIBC AND FERRITIN PANEL
Ferritin: 11 ng/mL — ABNORMAL LOW (ref 15–150)
Iron Saturation: 3 % — CL (ref 15–55)
Iron: 14 ug/dL — ABNORMAL LOW (ref 27–139)
Total Iron Binding Capacity: 519 ug/dL — ABNORMAL HIGH (ref 250–450)
UIBC: 505 ug/dL — ABNORMAL HIGH (ref 118–369)

## 2024-01-28 ENCOUNTER — Ambulatory Visit (HOSPITAL_BASED_OUTPATIENT_CLINIC_OR_DEPARTMENT_OTHER): Payer: Self-pay | Admitting: Family Medicine

## 2024-01-28 DIAGNOSIS — D509 Iron deficiency anemia, unspecified: Secondary | ICD-10-CM

## 2024-02-08 ENCOUNTER — Encounter: Payer: Self-pay | Admitting: Oncology

## 2024-02-08 ENCOUNTER — Inpatient Hospital Stay: Attending: Oncology | Admitting: Oncology

## 2024-02-08 ENCOUNTER — Inpatient Hospital Stay

## 2024-02-08 VITALS — BP 114/49 | HR 58 | Temp 96.7°F | Resp 16 | Ht 65.0 in | Wt 125.5 lb

## 2024-02-08 DIAGNOSIS — D509 Iron deficiency anemia, unspecified: Secondary | ICD-10-CM | POA: Insufficient documentation

## 2024-02-08 LAB — CBC (CANCER CENTER ONLY)
HCT: 32.5 % — ABNORMAL LOW (ref 36.0–46.0)
Hemoglobin: 9.4 g/dL — ABNORMAL LOW (ref 12.0–15.0)
MCH: 19.1 pg — ABNORMAL LOW (ref 26.0–34.0)
MCHC: 28.9 g/dL — ABNORMAL LOW (ref 30.0–36.0)
MCV: 66.2 fL — ABNORMAL LOW (ref 80.0–100.0)
Platelet Count: 299 K/uL (ref 150–400)
RBC: 4.91 MIL/uL (ref 3.87–5.11)
RDW: 22.3 % — ABNORMAL HIGH (ref 11.5–15.5)
WBC Count: 7.5 K/uL (ref 4.0–10.5)
nRBC: 0 % (ref 0.0–0.2)

## 2024-02-08 LAB — IRON AND TIBC
Iron: 17 ug/dL — ABNORMAL LOW (ref 28–170)
Saturation Ratios: 3 % — ABNORMAL LOW (ref 10.4–31.8)
TIBC: 592 ug/dL — ABNORMAL HIGH (ref 250–450)
UIBC: 575 ug/dL

## 2024-02-08 LAB — FOLATE: Folate: 25 ng/mL (ref >5.9)

## 2024-02-08 LAB — VITAMIN B12: Vitamin B-12: 138 pg/mL — ABNORMAL LOW (ref 180–914)

## 2024-02-08 LAB — FERRITIN: Ferritin: 4 ng/mL — ABNORMAL LOW (ref 11–307)

## 2024-02-08 NOTE — Progress Notes (Signed)
 [facility]  [phone] [phone]) [phone]  ID: Casmira Cramer OB: [birth year]  MR#: [id number]  RDW#:[id number]  Patient Care Team: de Peru, Quintin PARAS, MD as PCP - General (Family Medicine) Jacobo Evalene PARAS, MD as Consulting Physician (Hematology and Oncology)  CHIEF COMPLAINT: Iron deficiency anemia.  INTERVAL HISTORY: Patient is a 68 year old female with history of gastric bypass surgery who is noted to have persistently decreased hemoglobin and iron stores despite oral iron supplementation.  He has chronic weakness and fatigue, hair thinning, brittle nails, and restless leg.  He has no neurologic complaints.  She denies any recent fevers or illnesses.  She has a good appetite and denies weight loss.  She has no chest pain, shortness of breath, cough, or hemoptysis.  She denies any nausea, vomiting, constipation, or diarrhea.  She has no melena or hematochezia.  She has no urinary complaints.  Patient offers no further specific complaints today.  REVIEW OF SYSTEMS:   Review of Systems  Constitutional:  Positive for malaise/fatigue. Negative for fever and weight loss.  Respiratory: Negative.  Negative for cough, hemoptysis and shortness of breath.   Cardiovascular: Negative.  Negative for chest pain and leg swelling.  Gastrointestinal: Negative.  Negative for abdominal pain, blood in stool and melena.  Genitourinary: Negative.  Negative for dysuria.  Musculoskeletal: Negative.  Negative for back pain.  Skin: Negative.  Negative for rash.  Neurological:  Positive for weakness. Negative for dizziness, focal weakness and headaches.  Psychiatric/Behavioral: Negative.  The patient is not nervous/anxious.     As per HPI. Otherwise, a complete review of systems is negative.  PAST MEDICAL HISTORY: Past Medical History:  Diagnosis Date   Arthritis    knees bilat   Cataracts, bilateral    Cecal ulcer 10/02/2006   solitary. Path: benign acute on chronic  inflammation.     Choledocholithiasis    Complication of anesthesia     i HAVE A HARD TIME WAKING    Depression with anxiety    Dyslipidemia    GERD (gastroesophageal reflux disease)    Obesity    Osteopenia 06/04/2015   T score -1.2 FRAX 12%/0.8%   Pneumonia 10/02/2006   Sleep apnea    cpap every night   Vitamin D  deficiency     PAST SURGICAL HISTORY: Past Surgical History:  Procedure Laterality Date   ABDOMINAL HYSTERECTOMY  2005   LAVH-BSO,, MENORRHAGIA AND PELVIC PAIN   APPENDECTOMY  ~1940   CATARACT EXTRACTION     COLONOSCOPY  2008   HYSTEROSCOPY     D & C   laparoscopic sleeve gastric bypass  04-2014, [facility] in    OOPHORECTOMY     BSO   TONSILLECTOMY     TUBAL LIGATION      FAMILY HISTORY: Family History  Problem Relation Age of Onset   Colon cancer Father    Prostate cancer Father    Diabetes Sister    Diabetes Sister    Breast cancer Paternal Grandmother    Cholelithiasis Other        mom, dad, sisters, daughter   Esophageal cancer Neg Hx    Stomach cancer Neg Hx    Rectal cancer Neg Hx     ADVANCED DIRECTIVES (Y/N):  N  HEALTH MAINTENANCE: Social History   Tobacco Use   Smoking status: Some Days    Current packs/day: 0.50    Average packs/day: 0.5 packs/day for 20.0 years (10.0 ttl pk-yrs)    Types: Cigarettes   Smokeless  tobacco: Never  Vaping Use   Vaping status: Never Used  Substance Use Topics   Alcohol use: Yes    Comment: rare; a few ounces very rarely   Drug use: No     Colonoscopy:  PAP:  Bone density:  Lipid panel:  Allergies  Allergen Reactions   Acetaminophen Nausea And Vomiting   Diazepam Other (See Comments)   Hydrocodone-Acetaminophen Other (See Comments)    unknown   Propoxyphene Nausea And Vomiting and Other (See Comments)   Aspirin Other (See Comments)    Doesn't take due to stomach being so small from gastric bypass surgery    Darvocet [Propoxyphene N-Acetaminophen] Nausea And Vomiting    Etodolac Other (See Comments)    unknown   Flagyl [Metronidazole Hcl] Other (See Comments)    unknown   Hydrocodone-Acetaminophen Other (See Comments)    unknown   Nsaids Other (See Comments)    Doesn't take due to stomach being so small from gastric bypass surgery   Terazol [Terconazole] Other (See Comments)    unknown   Ultram [Tramadol Hcl] Other (See Comments)    unknown   Valium Other (See Comments)    unknown   Codeine Rash   Metronidazole Other (See Comments)   Penicillins Rash    Current Outpatient Medications  Medication Sig Dispense Refill   fluconazole  (DIFLUCAN ) 100 MG tablet Take 1 tablet (100 mg total) by mouth daily. Take 2 tablets first day and then 1 tablet until day 14. 15 tablet 0   Melatonin 1 MG CAPS Take by mouth.     Multiple Vitamin (MULTIVITAMIN WITH MINERALS) TABS tablet Take 1 tablet by mouth daily.     omeprazole  (PRILOSEC) 20 MG capsule Take two daily 180 capsule 3   sertraline  (ZOLOFT ) 50 MG tablet Take 1 daily 90 tablet 0   Chlorhexidine  Gluconate 2 % LIQD Apply 1 application  topically at bedtime. (Patient not taking: Reported on 02/08/2024) 118 mL 1   Cholecalciferol (VITAMIN D -1000 MAX ST) 1000 UNITS tablet Take 4,000 Units by mouth daily. (Patient not taking: Reported on 02/08/2024)     ferrous sulfate  325 (65 FE) MG EC tablet Take 1 tablet (325 mg total) by mouth every Monday, Wednesday, and Friday. (Patient not taking: Reported on 02/08/2024) 30 tablet 0   No current facility-administered medications for this visit.    OBJECTIVE: Vitals:   02/08/24 1456 02/08/24 1502  BP: (!) 103/37 (!) 114/49  Pulse:    Resp:    Temp:    SpO2:       Body mass index is 20.88 kg/m.    ECOG FS:0 - Asymptomatic  General: Well-developed, well-nourished, no acute distress. Eyes: Pink conjunctiva, anicteric sclera. HEENT: Normocephalic, moist mucous membranes. Lungs: No audible wheezing or coughing. Heart: Regular rate and rhythm. Abdomen: Soft, nontender, no  obvious distention. Musculoskeletal: No edema, cyanosis, or clubbing. Neuro: Alert, answering all questions appropriately. Cranial nerves grossly intact. Skin: No rashes or petechiae noted. Psych: Normal affect. Lymphatics: No cervical, calvicular, axillary or inguinal LAD.   LAB RESULTS:  Lab Results  Component Value Date   NA 143 02/23/2023   K 4.8 02/23/2023   CL 105 02/23/2023   CO2 25 02/23/2023   GLUCOSE 86 02/23/2023   BUN 13 02/23/2023   CREATININE 0.58 02/23/2023   CALCIUM  9.2 02/23/2023   PROT 6.5 02/23/2023   ALBUMIN 4.4 02/23/2023   AST 29 02/23/2023   ALT 22 02/23/2023   ALKPHOS 159 (H) 02/23/2023   BILITOT <0.2 02/23/2023  GFRNONAA >60 01/03/2015   GFRAA >60 01/03/2015    Lab Results  Component Value Date   WBC 7.5 02/08/2024   NEUTROABS 5.9 11/25/2023   HGB 9.4 (L) 02/08/2024   HCT 32.5 (L) 02/08/2024   MCV 66.2 (L) 02/08/2024   PLT 299 02/08/2024   Lab Results  Component Value Date   IRON 14 (L) 01/26/2024   TIBC 519 (H) 01/26/2024   IRONPCTSAT 3 (LL) 01/26/2024   Lab Results  Component Value Date   FERRITIN 11 (L) 01/26/2024     STUDIES: No results found.  ASSESSMENT: Iron deficiency anemia.  PLAN:    Iron deficiency anemia: Likely due to to poor absorption secondary to history of gastric bypass surgery.  Patient's hemoglobin and iron stores are persistently low despite oral iron supplementation.  She has been instructed to discontinue oral iron. Will proceed with 5 doses of 200 mg IV Venofer over the next 1 to 2 weeks.  Patient then return to clinic in 3 months with repeat laboratory, further evaluation, and continuation of treatment if needed. History of gastric bypass: Patient has been instructed to restart her multivitamin.  I spent a total of 45 minutes reviewing chart data, face-to-face evaluation with the patient, counseling and coordination of care as detailed above.   Patient expressed understanding and was in agreement with  this plan. She also understands that She can call clinic at any time with any questions, concerns, or complaints.    Evalene JINNY Reusing, MD   02/08/2024 4:09 PM

## 2024-02-08 NOTE — Progress Notes (Signed)
 Has hair loss, low energy, nails are peeling, and legs are jerking at night. Wonders if this is related to low iron. Also has issues with yeast. Has issues with itching.

## 2024-02-09 ENCOUNTER — Other Ambulatory Visit: Payer: Self-pay | Admitting: Oncology

## 2024-02-09 DIAGNOSIS — D509 Iron deficiency anemia, unspecified: Secondary | ICD-10-CM

## 2024-02-10 ENCOUNTER — Inpatient Hospital Stay

## 2024-02-14 ENCOUNTER — Inpatient Hospital Stay

## 2024-02-14 ENCOUNTER — Other Ambulatory Visit: Payer: Self-pay | Admitting: Oncology

## 2024-02-14 VITALS — BP 108/38 | HR 60 | Temp 97.0°F | Resp 17

## 2024-02-14 DIAGNOSIS — D509 Iron deficiency anemia, unspecified: Secondary | ICD-10-CM | POA: Diagnosis not present

## 2024-02-14 MED ORDER — DIPHENHYDRAMINE HCL 50 MG/ML IJ SOLN
25.0000 mg | Freq: Once | INTRAMUSCULAR | Status: AC
  Administered 2024-02-14: 25 mg via INTRAVENOUS

## 2024-02-14 MED ORDER — CYANOCOBALAMIN 1000 MCG/ML IJ SOLN
1000.0000 ug | Freq: Once | INTRAMUSCULAR | Status: AC
  Administered 2024-02-14: 1000 ug via INTRAMUSCULAR
  Filled 2024-02-14: qty 1

## 2024-02-14 MED ORDER — SODIUM CHLORIDE 0.9% FLUSH
10.0000 mL | Freq: Once | INTRAVENOUS | Status: AC | PRN
  Administered 2024-02-14: 10 mL
  Filled 2024-02-14: qty 10

## 2024-02-14 MED ORDER — IRON SUCROSE 20 MG/ML IV SOLN
200.0000 mg | Freq: Once | INTRAVENOUS | Status: AC
  Administered 2024-02-14: 200 mg via INTRAVENOUS
  Filled 2024-02-14: qty 10

## 2024-02-14 NOTE — Patient Instructions (Signed)

## 2024-02-14 NOTE — Progress Notes (Signed)
 Patient first time Venofer . Administered IV Venofer  at 1537 and B12 IM at 1542. Around 1550 patient c/o of extreme itching, slight redness on left arm denies SOB, thorat tightness or any other signs. Josh, NP notified at bedside ordered IV Benadryl  25mg . Vitals stable. Cold compress applied. 1604 patient states slight relief. BP slightly low patient states its always low denies dizziness. MD & NP notified, Verbal ok to d/c and f/u PCP. Patient asks for administration of Benadryl  here in clinic. MD notified. Patient eager to leave. Discharged, stable

## 2024-02-15 ENCOUNTER — Other Ambulatory Visit: Payer: Self-pay | Admitting: *Deleted

## 2024-02-15 MED ORDER — CYANOCOBALAMIN 1000 MCG/ML IJ SOLN: 1000.0000 ug | INTRAMUSCULAR | 0 refills | Status: DC

## 2024-02-15 MED ORDER — SYRINGE/NEEDLE (DISP) 25G X 1" 3 ML MISC: 1.0000 mL | 0 refills | Status: DC

## 2024-02-16 ENCOUNTER — Inpatient Hospital Stay

## 2024-02-16 VITALS — BP 105/50 | HR 60 | Temp 97.4°F | Resp 18

## 2024-02-16 DIAGNOSIS — D509 Iron deficiency anemia, unspecified: Secondary | ICD-10-CM

## 2024-02-16 MED ORDER — SODIUM CHLORIDE 0.9% FLUSH
10.0000 mL | Freq: Once | INTRAVENOUS | Status: AC | PRN
  Administered 2024-02-16: 10 mL
  Filled 2024-02-16: qty 10

## 2024-02-16 MED ORDER — IRON SUCROSE 20 MG/ML IV SOLN
200.0000 mg | Freq: Once | INTRAVENOUS | Status: AC
  Administered 2024-02-16: 200 mg via INTRAVENOUS
  Filled 2024-02-16: qty 10

## 2024-02-16 NOTE — Progress Notes (Signed)
 Pt reports the night of the Venofer  infusion the itching resolved but She felt like I got hit by a truck.  Pt reports she does not tolerate medications well and was able to sleep it off. Pt reports she took half a Benadryl  at noon. Per Dr. Jacobo okay to proceed with Venofer .   Pt tolerated treatment well, no s/s of distress or reaction noted. Pt denies itching, SOB, Chest pain, etc. No redness noted. Pt educated to call clinic with any concerns or seek emergency care in the event of an emergency such as SOB, throat closure, etc. Pt verbalizes understanding. MD aware, pt stable at discharge.

## 2024-02-18 ENCOUNTER — Inpatient Hospital Stay

## 2024-02-18 VITALS — BP 103/47 | HR 81 | Temp 97.5°F | Resp 18

## 2024-02-18 DIAGNOSIS — D509 Iron deficiency anemia, unspecified: Secondary | ICD-10-CM | POA: Diagnosis not present

## 2024-02-18 MED ORDER — IRON SUCROSE 20 MG/ML IV SOLN
200.0000 mg | Freq: Once | INTRAVENOUS | Status: AC
  Administered 2024-02-18: 200 mg via INTRAVENOUS
  Filled 2024-02-18: qty 10

## 2024-02-18 MED ORDER — DIPHENHYDRAMINE HCL 25 MG PO CAPS: 25.0000 mg | ORAL_CAPSULE | Freq: Once | ORAL | Status: DC

## 2024-02-21 ENCOUNTER — Inpatient Hospital Stay

## 2024-02-21 VITALS — BP 119/49 | HR 58 | Temp 95.0°F | Resp 17

## 2024-02-21 DIAGNOSIS — D509 Iron deficiency anemia, unspecified: Secondary | ICD-10-CM | POA: Diagnosis not present

## 2024-02-21 MED ORDER — IRON SUCROSE 20 MG/ML IV SOLN
200.0000 mg | Freq: Once | INTRAVENOUS | Status: AC
  Administered 2024-02-21: 200 mg via INTRAVENOUS
  Filled 2024-02-21: qty 10

## 2024-02-21 MED ORDER — DIPHENHYDRAMINE HCL 25 MG PO CAPS: 25.0000 mg | ORAL_CAPSULE | Freq: Once | ORAL | Status: DC

## 2024-02-24 ENCOUNTER — Inpatient Hospital Stay

## 2024-02-24 VITALS — BP 117/59 | HR 61 | Resp 18

## 2024-02-24 DIAGNOSIS — D509 Iron deficiency anemia, unspecified: Secondary | ICD-10-CM | POA: Diagnosis not present

## 2024-02-24 MED ORDER — IRON SUCROSE 20 MG/ML IV SOLN
200.0000 mg | Freq: Once | INTRAVENOUS | Status: AC
  Administered 2024-02-24: 200 mg via INTRAVENOUS
  Filled 2024-02-24: qty 10

## 2024-02-24 MED ORDER — DIPHENHYDRAMINE HCL 25 MG PO CAPS: 25.0000 mg | ORAL_CAPSULE | Freq: Once | ORAL | Status: DC

## 2024-03-15 ENCOUNTER — Telehealth: Payer: Self-pay

## 2024-03-15 DIAGNOSIS — F4323 Adjustment disorder with mixed anxiety and depressed mood: Secondary | ICD-10-CM

## 2024-03-15 NOTE — Telephone Encounter (Signed)
 Spoke with to schedule AWV-S. Patient is requesting refills be sent to [facility] in [place], [place]  for her Sertraline  and Omeprazole . Please call patient if you have questions.

## 2024-03-16 ENCOUNTER — Telehealth: Payer: Self-pay | Admitting: Oncology

## 2024-03-16 MED ORDER — SERTRALINE HCL 50 MG PO TABS: 50.0000 mg | ORAL_TABLET | Freq: Every day | ORAL | 1 refills | Status: DC

## 2024-03-16 MED ORDER — OMEPRAZOLE 20 MG PO CPDR: DELAYED_RELEASE_CAPSULE | ORAL | 0 refills | Status: DC

## 2024-03-16 NOTE — Telephone Encounter (Signed)
 Pt called and requested we shift her 70-month follow-up appts up because she is moving for work (not sure if that is temporary work or permanent). R/s appts and verified w/pt. - LH

## 2024-03-22 ENCOUNTER — Telehealth: Payer: Self-pay | Admitting: *Deleted

## 2024-03-22 NOTE — Telephone Encounter (Signed)
 She is a Dr. Jacobo patient and she has been getting IV iron .  The first 1 was on July 14, and him the second 1 was like 43 then July 21, then July 24.  States that she has headaches every day and she wonders if it is about the IV iron .  She said the day of coming to get the IV iron  she was already had a headache.  All the other the iron 's she did not have any issue based on the information while the nurses had her.  She is very careful about taking pills because of her having the gastric bypass.  She just wants to know if this is from the iron  or what is going on with her headaches

## 2024-03-23 ENCOUNTER — Encounter: Payer: Self-pay | Admitting: *Deleted

## 2024-03-30 ENCOUNTER — Ambulatory Visit: Payer: Self-pay

## 2024-03-30 NOTE — Telephone Encounter (Signed)
 FYI Only or Action Required?: Action required by provider: Wishes to speak to PCP.  Patient was last seen in primary care on 04/27/2023 by de Peru, Quintin PARAS, MD.  Called Nurse Triage reporting Headache.  Symptoms began several weeks ago.  Interventions attempted: Nothing.  Symptoms are: unchanged.  Triage Disposition: See PCP Within 2 Weeks  Patient/caregiver understands and will follow disposition?: No, wishes to speak with PCP        Reason for Disposition  Headache is a chronic symptom (recurrent or ongoing AND present > 4 weeks)  Answer Assessment - Initial Assessment Questions Started as a dull headache, progressed, 24/7 non stop starting 2 weeks ago. Denies nausea, blurred vision. States she's allergic to a lot of medication so has no taken anything. Patient currently in [place] , advised to UC or ED for symptoms, patient is requesting a call back from PCP. Patient stated whatever Everitt Peru tells me to do I will do   1. LOCATION: Where does it hurt?      Sometimes on left side, sometimes on right 2. ONSET: When did the headache start? (e.g., minutes, hours, days)      Dull headache started first week of July  3. PATTERN: Does the pain come and go, or has it been constant since it started?     Constant   4. SEVERITY: How bad is the pain? and What does it keep you from doing?  (e.g., Scale 1-10; mild, moderate, or severe)     Severe  6. CAUSE: What do you think is causing the headache?     Unknown  7. MIGRAINE: Have you been diagnosed with migraine headaches? If Yes, ask: Is this headache similar?      No  8. HEAD INJURY: Has there been any recent injury to your head?      No   9. OTHER SYMPTOMS: Do you have any other symptoms? (e.g., fever, stiff neck, eye pain, sore throat, cold symptoms)     No  Protocols used: Headache-A-AH

## 2024-03-30 NOTE — Telephone Encounter (Signed)
 First attempt; no answer; left vm   Summary: Headaches Advice   Pt has headaches but has appt with Dr. De Peru on 04/19/2024. She has infusions but has been experiencing headaches before infusions. She would like a call [phone] back on advise on what to do.

## 2024-03-31 NOTE — Telephone Encounter (Signed)
 Attempted to call pt but unable to reach. Left pt a detailed VM that PCP was out of the office until 9/3 but let her know that I was sending her message to both Dr. De Peru as well as Thersia. Stated to her in the VM that if she needed anything from the office prior to Dr Everitt Peru returning that she could either call the office back or could send us  a mychart message.

## 2024-04-06 NOTE — Telephone Encounter (Signed)
 Since was not able to reach pt by phone the day tried to call her sent pt a mychart message of the info per Dr. De Peru.

## 2024-04-17 ENCOUNTER — Other Ambulatory Visit (HOSPITAL_BASED_OUTPATIENT_CLINIC_OR_DEPARTMENT_OTHER): Payer: Self-pay | Admitting: Family Medicine

## 2024-04-17 DIAGNOSIS — F4323 Adjustment disorder with mixed anxiety and depressed mood: Secondary | ICD-10-CM

## 2024-04-17 MED ORDER — SERTRALINE HCL 50 MG PO TABS: 50.0000 mg | ORAL_TABLET | Freq: Every day | ORAL | 1 refills | Status: DC

## 2024-04-17 MED ORDER — OMEPRAZOLE 20 MG PO CPDR: DELAYED_RELEASE_CAPSULE | ORAL | 0 refills | Status: DC

## 2024-04-17 NOTE — Telephone Encounter (Signed)
 Copied from CRM [id number]. Topic: Clinical - Medication Refill >> Apr 17, 2024 11:10 AM Suzen RAMAN wrote: Medication: omeprazole  (PRILOSEC) 20 MG capsule sertraline  (ZOLOFT ) 50 MG tablet   Has the patient contacted their pharmacy? Yes   This is the patient's preferred pharmacy:  [id number] - [place] - [address] AT [facility] [address] [place]  [place] [address] Phone: [phone] Fax: [phone]   Is this the correct pharmacy for this prescription? Yes If no, delete pharmacy and type the correct one.   Has the prescription been filled recently? No  Is the patient out of the medication? No  Has the patient been seen for an appointment in the last year OR does the patient have an upcoming appointment? Yes  Can we respond through MyChart? Yes  Agent: Please be advised that Rx refills may take up to 3 business days. We ask that you follow-up with your pharmacy.

## 2024-04-18 ENCOUNTER — Telehealth: Payer: Self-pay | Admitting: *Deleted

## 2024-04-18 ENCOUNTER — Encounter (HOSPITAL_BASED_OUTPATIENT_CLINIC_OR_DEPARTMENT_OTHER): Payer: PPO

## 2024-04-18 ENCOUNTER — Ambulatory Visit: Payer: Self-pay

## 2024-04-18 NOTE — Telephone Encounter (Signed)
 FYI Only or Action Required?: FYI only for provider.  Patient was last seen in primary care on 04/27/2023 by de Peru, Quintin PARAS, MD.  Called Nurse Triage reporting Headache.  Symptoms began several months ago.  Interventions attempted: OTC medications: Tylenol .  Symptoms are: unchanged.  Triage Disposition: See PCP When Office is Open (Within 3 Days)  Patient/caregiver understands and will follow disposition?: Yes  **See note below**            1. LOCATION: Where does it hurt?      Left side temple area  2. ONSET: When did the headache start? (e.g., minutes, hours, days)      July 1st 2025  3. PATTERN: Does the pain come and go, or has it been constant since it started?     Constant   4. SEVERITY: How bad is the pain? and What does it keep you from doing?  (e.g., Scale 1-10; mild, moderate, or severe)     2/10  5. RECURRENT SYMPTOM: Have you ever had headaches before? If Yes, ask: When was the last time? and What happened that time?      No   6. CAUSE: What do you think is causing the headache?     Hit head on cabinet in July 2025  7. MIGRAINE: Have you been diagnosed with migraine headaches? If Yes, ask: Is this headache similar?      No   8. HEAD INJURY: Has there been any recent injury to your head?      No   9. OTHER SYMPTOMS: Do you have any other symptoms? (e.g., fever, stiff neck, eye pain, sore throat, cold symptoms)  No   Tylenol for pain, some relief noted. Patient declined an appointment with another provider as she only wishes to see Dr. De Peru. She will speak with oncology provider to address headache, and will follow up at a later time.

## 2024-04-18 NOTE — Telephone Encounter (Deleted)
 FYI Only or Action Required?: FYI only for provider.  Patient was last seen in primary care on 04/27/2023 by de Peru, Quintin PARAS, MD.  Called Nurse Triage reporting Headache.  Symptoms began {$Time; disease onset (duration):32870}.  Interventions attempted: {Primary Care interventions tried:32867}.  Symptoms are: {COURSE IMPROVING/WORSENING SX HPI:23693}.  Triage Disposition: No disposition on file.  Patient/caregiver understands and will follow disposition?:                          Message from [place] T sent at 04/18/2024  4:44 PM EDT  Ongoing headache, it does not go away, pain level is a 2 right now- [phone]   Reason for Disposition . [1] MILD-MODERATE headache AND [2] present > 3 days (72 hours) AND [3] no improvement after using Care Advice  Answer Assessment - Initial Assessment Questions 1. LOCATION: Where does it hurt?      Left side temple area  2. ONSET: When did the headache start? (e.g., minutes, hours, days)      July 1st 2025  3. PATTERN: Does the pain come and go, or has it been constant since it started?     Constant   4. SEVERITY: How bad is the pain? and What does it keep you from doing?  (e.g., Scale 1-10; mild, moderate, or severe)     2/10  5. RECURRENT SYMPTOM: Have you ever had headaches before? If Yes, ask: When was the last time? and What happened that time?      No   6. CAUSE: What do you think is causing the headache?     Hit head on cabinet in July 2025  7. MIGRAINE: Have you been diagnosed with migraine headaches? If Yes, ask: Is this headache similar?      No   8. HEAD INJURY: Has there been any recent injury to your head?      No   9. OTHER SYMPTOMS: Do you have any other symptoms? (e.g., fever, stiff neck, eye pain, sore throat, cold symptoms)  No   Tylenol for pain, some relief noted. Patient declined an appointment with another provider as she only wishes to see Dr. De Peru.  She will speak with oncology provider to address headache, and will follow up at a later time.  Protocols used: [facility]

## 2024-04-18 NOTE — Telephone Encounter (Signed)
 The dentist office wants to get the .  To take 2 extractions out of her mouth they want to know the labs and what that looks like before making the it can be done in the.  I asked to call back whenever you are can it is urgent and they are all from 1-2 for lunch much

## 2024-04-19 ENCOUNTER — Telehealth (HOSPITAL_BASED_OUTPATIENT_CLINIC_OR_DEPARTMENT_OTHER): Payer: Self-pay | Admitting: Family Medicine

## 2024-04-19 ENCOUNTER — Encounter (HOSPITAL_BASED_OUTPATIENT_CLINIC_OR_DEPARTMENT_OTHER): Admitting: Family Medicine

## 2024-04-19 NOTE — Telephone Encounter (Signed)
 Spoke to patient at length she is leaving Sunday for [place]  for work she states she has had a headache for a long time and it wont go away she is scared...she states she has been to the ED and they did not find anything she has also let her dentist and the cancer center know. She said she doesn't know what else to do and really would like to get an appointment to see Dr. Penne.  She also said she really appreciates everyone trying to help her at our office.  Please advise.

## 2024-04-20 ENCOUNTER — Inpatient Hospital Stay: Attending: Oncology

## 2024-04-20 DIAGNOSIS — Z9884 Bariatric surgery status: Secondary | ICD-10-CM | POA: Insufficient documentation

## 2024-04-20 DIAGNOSIS — Z90710 Acquired absence of both cervix and uterus: Secondary | ICD-10-CM | POA: Diagnosis not present

## 2024-04-20 DIAGNOSIS — Z885 Allergy status to narcotic agent status: Secondary | ICD-10-CM | POA: Insufficient documentation

## 2024-04-20 DIAGNOSIS — Z90722 Acquired absence of ovaries, bilateral: Secondary | ICD-10-CM | POA: Insufficient documentation

## 2024-04-20 DIAGNOSIS — Z9049 Acquired absence of other specified parts of digestive tract: Secondary | ICD-10-CM | POA: Insufficient documentation

## 2024-04-20 DIAGNOSIS — Z79899 Other long term (current) drug therapy: Secondary | ICD-10-CM | POA: Insufficient documentation

## 2024-04-20 DIAGNOSIS — Z880 Allergy status to penicillin: Secondary | ICD-10-CM | POA: Diagnosis not present

## 2024-04-20 DIAGNOSIS — Z886 Allergy status to analgesic agent status: Secondary | ICD-10-CM | POA: Diagnosis not present

## 2024-04-20 DIAGNOSIS — Z833 Family history of diabetes mellitus: Secondary | ICD-10-CM | POA: Insufficient documentation

## 2024-04-20 DIAGNOSIS — D509 Iron deficiency anemia, unspecified: Secondary | ICD-10-CM | POA: Insufficient documentation

## 2024-04-20 DIAGNOSIS — Z8042 Family history of malignant neoplasm of prostate: Secondary | ICD-10-CM | POA: Insufficient documentation

## 2024-04-20 DIAGNOSIS — E785 Hyperlipidemia, unspecified: Secondary | ICD-10-CM | POA: Diagnosis not present

## 2024-04-20 DIAGNOSIS — Z803 Family history of malignant neoplasm of breast: Secondary | ICD-10-CM | POA: Insufficient documentation

## 2024-04-20 DIAGNOSIS — Z881 Allergy status to other antibiotic agents status: Secondary | ICD-10-CM | POA: Diagnosis not present

## 2024-04-20 DIAGNOSIS — Z800 Family history of malignant neoplasm of digestive organs: Secondary | ICD-10-CM | POA: Diagnosis not present

## 2024-04-20 DIAGNOSIS — Z8701 Personal history of pneumonia (recurrent): Secondary | ICD-10-CM | POA: Diagnosis not present

## 2024-04-20 DIAGNOSIS — F17210 Nicotine dependence, cigarettes, uncomplicated: Secondary | ICD-10-CM | POA: Insufficient documentation

## 2024-04-20 LAB — CBC WITH DIFFERENTIAL/PLATELET
Abs Immature Granulocytes: 0.03 K/uL (ref 0.00–0.07)
Basophils Absolute: 0 K/uL (ref 0.0–0.1)
Basophils Relative: 0 %
Eosinophils Absolute: 0.1 K/uL (ref 0.0–0.5)
Eosinophils Relative: 1 %
HCT: 39.6 % (ref 36.0–46.0)
Hemoglobin: 12.8 g/dL (ref 12.0–15.0)
Immature Granulocytes: 0 %
Lymphocytes Relative: 26 %
Lymphs Abs: 1.8 K/uL (ref 0.7–4.0)
MCH: 26.5 pg (ref 26.0–34.0)
MCHC: 32.3 g/dL (ref 30.0–36.0)
MCV: 82 fL (ref 80.0–100.0)
Monocytes Absolute: 0.4 K/uL (ref 0.1–1.0)
Monocytes Relative: 6 %
Neutro Abs: 4.6 K/uL (ref 1.7–7.7)
Neutrophils Relative %: 67 %
Platelets: 165 K/uL (ref 150–400)
RBC: 4.83 MIL/uL (ref 3.87–5.11)
WBC: 7 K/uL (ref 4.0–10.5)
nRBC: 0 % (ref 0.0–0.2)

## 2024-04-20 LAB — IRON AND TIBC
Iron: 68 ug/dL (ref 28–170)
Saturation Ratios: 16 % (ref 10.4–31.8)
TIBC: 419 ug/dL (ref 250–450)
UIBC: 351 ug/dL

## 2024-04-20 LAB — FERRITIN: Ferritin: 28 ng/mL (ref 11–307)

## 2024-04-20 LAB — VITAMIN B12: Vitamin B-12: 2519 pg/mL — ABNORMAL HIGH (ref 180–914)

## 2024-04-21 ENCOUNTER — Encounter: Payer: Self-pay | Admitting: Oncology

## 2024-04-21 ENCOUNTER — Inpatient Hospital Stay: Admitting: Oncology

## 2024-04-21 ENCOUNTER — Inpatient Hospital Stay

## 2024-04-21 VITALS — BP 122/59 | HR 67 | Temp 97.3°F | Resp 16 | Wt 125.0 lb

## 2024-04-21 DIAGNOSIS — D509 Iron deficiency anemia, unspecified: Secondary | ICD-10-CM | POA: Diagnosis not present

## 2024-04-21 NOTE — Progress Notes (Signed)
 [facility]  [phone]) [phone] Fax:([phone]) [phone]  ID: Richardine Peppers OB: 1955-11-03  MR#: [id number]  RDW#:[id number]  Patient Care Team: [place], Quintin PARAS, MD as PCP - General (Family Medicine) Jacobo Evalene PARAS, MD as Consulting Physician (Hematology and Oncology)  CHIEF COMPLAINT: Iron  deficiency anemia.  INTERVAL HISTORY: Patient returns to clinic today for repeat laboratory work, further evaluation, and consideration of additional IV Venofer .  She continues to have multiple complaints most of which are related to ongoing issues with her teeth that are actively being evaluated and treatment by dentistry.  She does not complain of weakness or fatigue today.  He has no neurologic complaints.  She denies any recent fevers or illnesses.  She has a good appetite and denies weight loss.  She has no chest pain, shortness of breath, cough, or hemoptysis.  She denies any nausea, vomiting, constipation, or diarrhea.  She has no melena or hematochezia.  She has no urinary complaints.  Patient offers no further specific complaints today.  REVIEW OF SYSTEMS:   Review of Systems  Constitutional: Negative.  Negative for fever, malaise/fatigue and weight loss.  Respiratory: Negative.  Negative for cough, hemoptysis and shortness of breath.   Cardiovascular: Negative.  Negative for chest pain and leg swelling.  Gastrointestinal: Negative.  Negative for abdominal pain, blood in stool and melena.  Genitourinary: Negative.  Negative for dysuria.  Musculoskeletal: Negative.  Negative for back pain.  Skin: Negative.  Negative for rash.  Neurological: Negative.  Negative for dizziness, focal weakness, weakness and headaches.  Psychiatric/Behavioral: Negative.  The patient is not nervous/anxious.     As per HPI. Otherwise, a complete review of systems is negative.  PAST MEDICAL HISTORY: Past Medical History:  Diagnosis Date   Arthritis    knees bilat   Cataracts,  bilateral    Cecal ulcer 10/02/2006   solitary. Path: benign acute on chronic inflammation.     Choledocholithiasis    Complication of anesthesia     i HAVE A HARD TIME WAKING    Depression with anxiety    Dyslipidemia    GERD (gastroesophageal reflux disease)    Obesity    Osteopenia 06/04/2015   T score -1.2 FRAX 12%/0.8%   Pneumonia 10/02/2006   Sleep apnea    cpap every night   Vitamin D  deficiency     PAST SURGICAL HISTORY: Past Surgical History:  Procedure Laterality Date   ABDOMINAL HYSTERECTOMY  2005   LAVH-BSO,, MENORRHAGIA AND PELVIC PAIN   APPENDECTOMY  ~1940   CATARACT EXTRACTION     COLONOSCOPY  2008   HYSTEROSCOPY     D & C   laparoscopic sleeve gastric bypass  04-2014, [facility] in [place]   OOPHORECTOMY     BSO   TONSILLECTOMY     TUBAL LIGATION      FAMILY HISTORY: Family History  Problem Relation Age of Onset   Colon cancer Father    Prostate cancer Father    Diabetes Sister    Diabetes Sister    Breast cancer Paternal Grandmother    Cholelithiasis Other        mom, dad, sisters, daughter   Esophageal cancer Neg Hx    Stomach cancer Neg Hx    Rectal cancer Neg Hx     ADVANCED DIRECTIVES (Y/N):  N  HEALTH MAINTENANCE: Social History   Tobacco Use   Smoking status: Some Days    Current packs/day: 0.50    Average packs/day: 0.5 packs/day for 20.0  years (10.0 ttl pk-yrs)    Types: Cigarettes   Smokeless tobacco: Never  Vaping Use   Vaping status: Never Used  Substance Use Topics   Alcohol use: Yes    Comment: rare; a few ounces very rarely   Drug use: No     Colonoscopy:  PAP:  Bone density:  Lipid panel:  Allergies  Allergen Reactions   Acetaminophen Nausea And Vomiting   Diazepam Other (See Comments)   Hydrocodone-Acetaminophen Other (See Comments)    unknown   Propoxyphene Nausea And Vomiting and Other (See Comments)   Aspirin Other (See Comments)    Doesn't take due to stomach being so small from gastric bypass  surgery    Darvocet [Propoxyphene N-Acetaminophen] Nausea And Vomiting   Etodolac Other (See Comments)    unknown   Flagyl [Metronidazole Hcl] Other (See Comments)    unknown   Hydrocodone-Acetaminophen Other (See Comments)    unknown   Nsaids Other (See Comments)    Doesn't take due to stomach being so small from gastric bypass surgery   Terazol [Terconazole] Other (See Comments)    unknown   Ultram [Tramadol Hcl] Other (See Comments)    unknown   Valium Other (See Comments)    unknown   Venofer  [Iron  Sucrose] Itching    Slight redness & itching See note 02/14/24   Codeine Rash   Metronidazole Other (See Comments)   Penicillins Rash    Current Outpatient Medications  Medication Sig Dispense Refill   clotrimazole (MYCELEX) 10 MG troche Take 10 mg by mouth 5 (five) times daily.     cyanocobalamin  (VITAMIN B12) 1000 MCG/ML injection Inject 1 mL (1,000 mcg total) into the muscle every 30 (thirty) days. 3 mL 0   fluconazole  (DIFLUCAN ) 100 MG tablet Take 1 tablet (100 mg total) by mouth daily. Take 2 tablets first day and then 1 tablet until day 14. 15 tablet 0   Melatonin 1 MG CAPS Take by mouth.     Multiple Vitamin (MULTIVITAMIN WITH MINERALS) TABS tablet Take 1 tablet by mouth daily.     omeprazole  (PRILOSEC) 20 MG capsule Take two daily 180 capsule 0   sertraline  (ZOLOFT ) 50 MG tablet Take 1 tablet (50 mg total) by mouth daily. 90 tablet 1   SYRINGE-NEEDLE, DISP, 3 ML 25G X 1 3 ML MISC Inject 1 mL into the muscle every 30 (thirty) days. 3 each 0   Chlorhexidine  Gluconate 2 % LIQD Apply 1 application  topically at bedtime. (Patient not taking: Reported on 04/21/2024) 118 mL 1   Cholecalciferol (VITAMIN D -1000 MAX ST) 1000 UNITS tablet Take 4,000 Units by mouth daily. (Patient not taking: Reported on 04/21/2024)     ferrous sulfate  325 (65 FE) MG EC tablet Take 1 tablet (325 mg total) by mouth every Monday, Wednesday, and Friday. (Patient not taking: Reported on 04/21/2024) 30 tablet 0    No current facility-administered medications for this visit.    OBJECTIVE: Vitals:   04/21/24 1117  BP: (!) 122/59  Pulse: 67  Resp: 16  Temp: (!) 97.3 F (36.3 C)  SpO2: 98%     Body mass index is 20.8 kg/m.    ECOG FS:0 - Asymptomatic  General: Well-developed, well-nourished, no acute distress. Eyes: Pink conjunctiva, anicteric sclera. HEENT: Normocephalic, moist mucous membranes. Lungs: No audible wheezing or coughing. Heart: Regular rate and rhythm. Abdomen: Soft, nontender, no obvious distention. Musculoskeletal: No edema, cyanosis, or clubbing. Neuro: Alert, answering all questions appropriately. Cranial nerves grossly intact. Skin: No  rashes or petechiae noted. Psych: Normal affect.  LAB RESULTS:  Lab Results  Component Value Date   NA 143 02/23/2023   K 4.8 02/23/2023   CL 105 02/23/2023   CO2 25 02/23/2023   GLUCOSE 86 02/23/2023   BUN 13 02/23/2023   CREATININE 0.58 02/23/2023   CALCIUM  9.2 02/23/2023   PROT 6.5 02/23/2023   ALBUMIN 4.4 02/23/2023   AST 29 02/23/2023   ALT 22 02/23/2023   ALKPHOS 159 (H) 02/23/2023   BILITOT <0.2 02/23/2023   GFRNONAA >60 01/03/2015   GFRAA >60 01/03/2015    Lab Results  Component Value Date   WBC 7.0 04/20/2024   NEUTROABS 4.6 04/20/2024   HGB 12.8 04/20/2024   HCT 39.6 04/20/2024   MCV 82.0 04/20/2024   PLT 165 04/20/2024   Lab Results  Component Value Date   IRON  68 04/20/2024   TIBC 419 04/20/2024   IRONPCTSAT 16 04/20/2024   Lab Results  Component Value Date   FERRITIN 28 04/20/2024     STUDIES: No results found.  ASSESSMENT: Iron  deficiency anemia.  PLAN:    Iron  deficiency anemia: Likely due to to poor absorption secondary to history of gastric bypass surgery.  Patient's hemoglobin and iron  stores are now within normal limits.  She does not require additional IV iron  today.  Patient last received treatment on February 24, 2024.  No further intervention is needed.  Patient reports she lives  in her mobile home and is returning to [place] .  She will not be back in [place]  until May 2026.  Return to clinic then with repeat laboratory, further evaluation, and continuation of treatment if needed.   History of gastric bypass: Patient was previously instructed restart her multivitamin. Dental issues: Continue follow-up with dentistry as recommended.  I spent a total of 20 minutes reviewing chart data, face-to-face evaluation with the patient, counseling and coordination of care as detailed above.   Patient expressed understanding and was in agreement with this plan. She also understands that She can call clinic at any time with any questions, concerns, or complaints.    Evalene JINNY Reusing, MD   04/21/2024 1:02 PM

## 2024-04-26 ENCOUNTER — Encounter (HOSPITAL_BASED_OUTPATIENT_CLINIC_OR_DEPARTMENT_OTHER): Admitting: Family Medicine

## 2024-05-04 NOTE — Telephone Encounter (Signed)
created in error.

## 2024-05-15 ENCOUNTER — Other Ambulatory Visit: Payer: Self-pay | Admitting: *Deleted

## 2024-05-15 ENCOUNTER — Telehealth (HOSPITAL_BASED_OUTPATIENT_CLINIC_OR_DEPARTMENT_OTHER): Payer: Self-pay

## 2024-05-15 ENCOUNTER — Encounter (HOSPITAL_BASED_OUTPATIENT_CLINIC_OR_DEPARTMENT_OTHER): Payer: Self-pay

## 2024-05-15 ENCOUNTER — Ambulatory Visit (HOSPITAL_BASED_OUTPATIENT_CLINIC_OR_DEPARTMENT_OTHER)

## 2024-05-15 VITALS — Ht 65.0 in | Wt 125.0 lb

## 2024-05-15 DIAGNOSIS — F4323 Adjustment disorder with mixed anxiety and depressed mood: Secondary | ICD-10-CM

## 2024-05-15 DIAGNOSIS — Z0000 Encounter for general adult medical examination without abnormal findings: Secondary | ICD-10-CM

## 2024-05-15 MED ORDER — OMEPRAZOLE 20 MG PO CPDR: DELAYED_RELEASE_CAPSULE | ORAL | 0 refills | Status: AC

## 2024-05-15 MED ORDER — CYANOCOBALAMIN 1000 MCG/ML IJ SOLN: 1000.0000 ug | INTRAMUSCULAR | 3 refills | Status: AC

## 2024-05-15 MED ORDER — SYRINGE/NEEDLE (DISP) 25G X 1" 3 ML MISC: 1.0000 mL | 3 refills | Status: AC

## 2024-05-15 MED ORDER — SERTRALINE HCL 50 MG PO TABS: 50.0000 mg | ORAL_TABLET | Freq: Every day | ORAL | 1 refills | Status: AC

## 2024-05-15 NOTE — Telephone Encounter (Signed)
 Patient is in [place]  working and will not be back until April/May. She is needing a refill on b12 to the pharmacy on file in [place]  If she is to continue the b12 shots.

## 2024-05-15 NOTE — Telephone Encounter (Signed)
 Patient states that she had an appt scheduled but it was canceled by the provider. She is in [place]  for work and needs refills sent to [place]  pharmacy, sertraline , omeprazole . She cannot be seen until April or May of next year. She also states that she has had a headache since July. She was told by emergency to take tylenol but she is a bariatric patient. She states that nothing helps. It is at a 1 in the morning and just gets worse throughout the day.

## 2024-05-15 NOTE — Progress Notes (Signed)
 Subjective:   Heidi Pugh is a 68 y.o. female who presents for Medicare Annual (Subsequent) preventive examination.  Visit Complete: Virtual I connected with  Heidi Pugh on 05/15/24 by a video and audio enabled telemedicine application and verified that I am speaking with the correct person using two identifiers.  Patient Location: Home  Provider Location: Home Office  I discussed the limitations of evaluation and management by telemedicine. The patient expressed understanding and agreed to proceed.  Vital Signs: Because this visit was a virtual/telehealth visit, some criteria may be missing or patient reported. Any vitals not documented were not able to be obtained and vitals that have been documented are patient reported.  Cardiac Risk Factors include: advanced age (>32men, >88 women);diabetes mellitus;smoking/ tobacco exposure;dyslipidemia     Objective:    Today's Vitals   05/15/24 1043 05/15/24 1044  Weight: 125 lb (56.7 kg)   Height: 5' 5 (1.651 m)   PainSc:  1    Body mass index is 20.8 kg/m.     05/15/2024   11:02 AM 04/21/2024   10:59 AM 02/08/2024    2:52 PM 04/13/2023   10:39 AM 01/01/2015    1:24 AM 04/20/2014    1:00 PM  Advanced Directives  Does Patient Have a Medical Advance Directive? Yes Yes Yes No No  No   Type of Estate agent of [place];Living will  Healthcare Power of [place];Living will     Does patient want to make changes to medical advance directive? No - Patient declined       Copy of Healthcare Power of Attorney in Chart? No - copy requested       Would patient like information on creating a medical advance directive?    No - Patient declined  No - patient declined information      Data saved with a previous flowsheet row definition    Current Medications (verified) Outpatient Encounter Medications as of 05/15/2024  Medication Sig   clotrimazole (MYCELEX) 10 MG troche Take 10 mg by mouth 5 (five) times daily.    cyanocobalamin  (VITAMIN B12) 1000 MCG/ML injection Inject 1 mL (1,000 mcg total) into the muscle every 30 (thirty) days.   fluconazole  (DIFLUCAN ) 100 MG tablet Take 1 tablet (100 mg total) by mouth daily. Take 2 tablets first day and then 1 tablet until day 14.   Melatonin 1 MG CAPS Take by mouth. (Patient taking differently: Take by mouth.)   Multiple Vitamin (MULTIVITAMIN WITH MINERALS) TABS tablet Take 1 tablet by mouth daily.   nystatin (MYCOSTATIN) 100000 UNIT/ML suspension Take 5 mLs by mouth 4 (four) times daily.   omeprazole  (PRILOSEC) 20 MG capsule Take two daily   sertraline  (ZOLOFT ) 50 MG tablet Take 1 tablet (50 mg total) by mouth daily.   Chlorhexidine  Gluconate 2 % LIQD Apply 1 application  topically at bedtime. (Patient not taking: Reported on 04/21/2024)   Cholecalciferol (VITAMIN D -1000 MAX ST) 1000 UNITS tablet Take 4,000 Units by mouth daily. (Patient not taking: Reported on 04/21/2024)   ferrous sulfate  325 (65 FE) MG EC tablet Take 1 tablet (325 mg total) by mouth every Monday, Wednesday, and Friday. (Patient not taking: Reported on 04/21/2024)   SYRINGE-NEEDLE, DISP, 3 ML 25G X 1 3 ML MISC Inject 1 mL into the muscle every 30 (thirty) days.   No facility-administered encounter medications on file as of 05/15/2024.    Allergies (verified) Acetaminophen, Diazepam, Hydrocodone-acetaminophen, Propoxyphene, Aspirin, Darvocet [propoxyphene n-acetaminophen], Etodolac, Flagyl [metronidazole hcl], Hydrocodone-acetaminophen,  Nsaids, Terazol [terconazole], Ultram [tramadol hcl], Valium, Venofer  [iron  sucrose], Codeine, Metronidazole, and Penicillins   History: Past Medical History:  Diagnosis Date   Arthritis    knees bilat   Cataracts, bilateral    Cecal ulcer 10/02/2006   solitary. Path: benign acute on chronic inflammation.     Choledocholithiasis    Complication of anesthesia     i HAVE A HARD TIME WAKING    Depression with anxiety    Dyslipidemia    GERD  (gastroesophageal reflux disease)    Obesity    Osteopenia 06/04/2015   T score -1.2 FRAX 12%/0.8%   Pneumonia 10/02/2006   Sleep apnea    cpap every night   Vitamin D  deficiency    Past Surgical History:  Procedure Laterality Date   ABDOMINAL HYSTERECTOMY  2005   LAVH-BSO,, MENORRHAGIA AND PELVIC PAIN   APPENDECTOMY  ~1940   CATARACT EXTRACTION     COLONOSCOPY  2008   HYSTEROSCOPY     D & C   laparoscopic sleeve gastric bypass  04-2014, [facility] in [place]   OOPHORECTOMY     BSO   TONSILLECTOMY     TUBAL LIGATION     Family History  Problem Relation Age of Onset   Colon cancer Father    Prostate cancer Father    Diabetes Sister    Diabetes Sister    Breast cancer Paternal Grandmother    Cholelithiasis Other        mom, dad, sisters, daughter   Esophageal cancer Neg Hx    Stomach cancer Neg Hx    Rectal cancer Neg Hx    Social History   Socioeconomic History   Marital status: Married    Spouse name: Not on file   Number of children: Not on file   Years of education: Not on file   Highest education level: Not on file  Occupational History   Not on file  Tobacco Use   Smoking status: Some Days    Current packs/day: 0.50    Average packs/day: 0.5 packs/day for 20.0 years (10.0 ttl pk-yrs)    Types: Cigarettes   Smokeless tobacco: Never  Vaping Use   Vaping status: Never Used  Substance and Sexual Activity   Alcohol use: Yes    Comment: rare; a few ounces very rarely   Drug use: No   Sexual activity: Not Currently    Comment: Declined sexual Hx questions  Other Topics Concern   Not on file  Social History Narrative   Not on file   Social Drivers of Health   Financial Resource Strain: Low Risk  (05/15/2024)   Overall Financial Resource Strain (CARDIA)    Difficulty of Paying Living Expenses: Not hard at all  Recent Concern: Financial Resource Strain - Medium Risk (02/22/2024)   Received from [facility]   Overall Financial  Resource Strain (CARDIA)    Difficulty of Paying Living Expenses: Somewhat hard  Food Insecurity: No Food Insecurity (05/15/2024)   Hunger Vital Sign    Worried About Running Out of Food in the Last Year: Never true    Ran Out of Food in the Last Year: Never true  Transportation Needs: No Transportation Needs (05/15/2024)   PRAPARE - Administrator, Civil Service (Medical): No    Lack of Transportation (Non-Medical): No  Physical Activity: Patient Declined (05/15/2024)   Exercise Vital Sign    Days of Exercise per Week: Patient declined    Minutes of Exercise  per Session: Patient declined  Stress: No Stress Concern Present (05/15/2024)   [employer] of Occupational Health - Occupational Stress Questionnaire    Feeling of Stress: Not at all  Social Connections: Socially Integrated (05/15/2024)   Social Connection and Isolation Panel    Frequency of Communication with Friends and Family: Three times a week    Frequency of Social Gatherings with Friends and Family: Three times a week    Attends Religious Services: More than 4 times per year    Active Member of Clubs or Organizations: Yes    Attends Engineer, structural: More than 4 times per year    Marital Status: Married    Tobacco Counseling Ready to quit: Not Answered Counseling given: Not Answered   Clinical Intake:  Pre-visit preparation completed: Yes  Pain : 0-10 Pain Score: 1  Pain Type: Chronic pain Pain Location: Head Pain Descriptors / Indicators: Aching Pain Onset: More than a month ago Pain Frequency: Constant Pain Relieving Factors: nothing  Pain Relieving Factors: nothing  BMI - recorded: 20.88 Nutritional Risks: None Diabetes: Yes CBG done?: No Did pt. bring in CBG monitor from home?: No  How often do you need to have someone help you when you read instructions, pamphlets, or other written materials from your doctor or pharmacy?: 1 - Never What is the last grade level you  completed in school?: 12th  Interpreter Needed?: No  Information entered by :: Arnette Hoots, CMA   Activities of Daily Living    05/15/2024   10:51 AM  In your present state of health, do you have any difficulty performing the following activities:  Hearing? 0  Vision? 0  Difficulty concentrating or making decisions? 0  Walking or climbing stairs? 0  Dressing or bathing? 0  Doing errands, shopping? 0  Preparing Food and eating ? N  Using the Toilet? N  In the past six months, have you accidently leaked urine? N  Do you have problems with loss of bowel control? N  Managing your Medications? N  Managing your Finances? N  Housekeeping or managing your Housekeeping? N    Patient Care Team: [place], Quintin PARAS, MD as PCP - General (Family Medicine) Jacobo Evalene PARAS, MD as Consulting Physician (Hematology and Oncology)  Indicate any recent Medical Services you may have received from other than [facility] providers in the past year (date may be approximate).     Assessment:   This is a routine wellness examination for [place].  Hearing/Vision screen Hearing Screening - Comments:: No issues Vision Screening - Comments:: No issues-[facility] [place]   Goals Addressed   None    Depression Screen    05/15/2024   10:54 AM 04/21/2024   11:13 AM 02/21/2024    1:36 PM 02/08/2024    2:56 PM 04/27/2023    2:23 PM 04/13/2023   10:53 AM 02/23/2023    2:49 PM  PHQ 2/9 Scores  PHQ - 2 Score 0 0 0 0 0 0 0  Exception Documentation       Medical reason    Fall Risk    05/15/2024   11:03 AM 04/13/2023   10:56 AM 02/23/2023    2:49 PM  Fall Risk   Falls in the past year? 0 0 0  Number falls in past yr: 0 0 0  Injury with Fall? 0 0 0  Risk for fall due to : No Fall Risks No Fall Risks No Fall Risks  Follow up Falls evaluation completed;Education provided  Falls prevention discussed Falls evaluation completed    MEDICARE RISK AT HOME: Medicare Risk at Home Any stairs in or around  the home?: Yes If so, are there any without handrails?: No Home free of loose throw rugs in walkways, pet beds, electrical cords, etc?: No Adequate lighting in your home to reduce risk of falls?: Yes Life alert?: No Use of a cane, walker or w/c?: No Grab bars in the bathroom?: Yes Shower chair or bench in shower?: No Elevated toilet seat or a handicapped toilet?: No  TIMED UP AND GO:  Was the test performed?  No    Cognitive Function:        05/15/2024   11:04 AM 04/13/2023   10:52 AM  6CIT Screen  What Year? 0 points 0 points  What month? 0 points 0 points  What time? 0 points 0 points  Count back from 20 0 points 0 points  Months in reverse 0 points 0 points  Repeat phrase 0 points 0 points  Total Score 0 points 0 points    Immunizations  There is no immunization history on file for this patient.  TDAP status: Up to date  Flu Vaccine status: Declined, Education has been provided regarding the importance of this vaccine but patient still declined. Advised may receive this vaccine at local pharmacy or Health Dept. Aware to provide a copy of the vaccination record if obtained from local pharmacy or Health Dept. Verbalized acceptance and understanding.  Pneumococcal vaccine status: Due, Education has been provided regarding the importance of this vaccine. Advised may receive this vaccine at local pharmacy or Health Dept. Aware to provide a copy of the vaccination record if obtained from local pharmacy or Health Dept. Verbalized acceptance and understanding.  Covid-19 vaccine status: Information provided on how to obtain vaccines.   Qualifies for Shingles Vaccine? Yes   Zostavax completed Unsure if she has had it  Shingrix Completed?: No.    Education has been provided regarding the importance of this vaccine. Patient has been advised to call insurance company to determine out of pocket expense if they have not yet received this vaccine. Advised may also receive vaccine at  local pharmacy or Health Dept. Verbalized acceptance and understanding.  Screening Tests Health Maintenance  Topic Date Due   COVID-19 Vaccine (1) Never done   FOOT EXAM  Never done   OPHTHALMOLOGY EXAM  Never done   Diabetic kidney evaluation - Urine ACR  Never done   Hepatitis C Screening  Never done   DTaP/Tdap/Td (1 - Tdap) Never done   Pneumococcal Vaccine: 50+ Years (1 of 2 - PCV) Never done   Zoster Vaccines- Shingrix (1 of 2) Never done   HEMOGLOBIN A1C  08/26/2023   Diabetic kidney evaluation - eGFR measurement  02/23/2024   Influenza Vaccine  Never done   Medicare Annual Wellness (AWV)  04/12/2024   Mammogram  04/28/2024   Colonoscopy  05/28/2027   DEXA SCAN  Completed   Meningococcal B Vaccine  Aged Out    Health Maintenance  Health Maintenance Due  Topic Date Due   COVID-19 Vaccine (1) Never done   FOOT EXAM  Never done   OPHTHALMOLOGY EXAM  Never done   Diabetic kidney evaluation - Urine ACR  Never done   Hepatitis C Screening  Never done   DTaP/Tdap/Td (1 - Tdap) Never done   Pneumococcal Vaccine: 50+ Years (1 of 2 - PCV) Never done   Zoster Vaccines- Shingrix (1 of 2) Never  done   HEMOGLOBIN A1C  08/26/2023   Diabetic kidney evaluation - eGFR measurement  02/23/2024   Influenza Vaccine  Never done   Medicare Annual Wellness (AWV)  04/12/2024   Mammogram  04/28/2024    Colorectal cancer screening: Type of screening: Colonoscopy. Completed 05/27/2022. Repeat every 5 years  Mammogram status: Completed 04/29/2023. Repeat every year  Bone Density status: Completed 06/12/2020. Results reflect: Bone density results: OSTEOPENIA. Repeat every 2 years.  Lung Cancer Screening: (Low Dose CT Chest recommended if Age 63-80 years, 20 pack-year currently smoking OR have quit w/in 15years.) does not qualify.   Lung Cancer Screening Referral: n/a  Additional Screening:  Hepatitis C Screening: does qualify; Completed n/a  Vision Screening: Recommended annual  ophthalmology exams for early detection of glaucoma and other disorders of the eye. Is the patient up to date with their annual eye exam?  Yes  Who is the provider or what is the name of the office in which the patient attends annual eye exams? [facility] If pt is not established with a provider, would they like to be referred to a provider to establish care? No .   Dental Screening: Recommended annual dental exams for proper oral hygiene  Diabetic Foot Exam: Diabetic Foot Exam: Overdue, Pt has been advised about the importance in completing this exam. Pt is scheduled for diabetic foot exam on Needs appt scheduled.  Community Resource Referral / Chronic Care Management: CRR required this visit?  No   CCM required this visit?  No     Plan:     I have personally reviewed and noted the following in the patient's chart:   Medical and social history Use of alcohol, tobacco or illicit drugs  Current medications and supplements including opioid prescriptions. Patient is not currently taking opioid prescriptions. Functional ability and status Nutritional status Physical activity Advanced directives List of other physicians Hospitalizations, surgeries, and ER visits in previous 12 months Vitals Screenings to include cognitive, depression, and falls Referrals and appointments  In addition, I have reviewed and discussed with patient certain preventive protocols, quality metrics, and best practice recommendations. A written personalized care plan for preventive services as well as general preventive health recommendations were provided to patient.     Arnette LOISE Hoots, CMA   05/15/2024   After Visit Summary: (Mail) Due to this being a telephonic visit, the after visit summary with patients personalized plan was offered to patient via mail   Nurse Notes: Patient states that she is out of town until April or May for work but she is need of refills. Message sent to  provider.

## 2024-05-15 NOTE — Telephone Encounter (Signed)
 Spoke with patient advised with verbal understanding patient went to ER did CT scan and they did nothing. The CT scan was done in September. The appointment here was cancelled and she left to go back to [place] . Will follow up in [place]  if headaches get worse

## 2024-05-15 NOTE — Patient Instructions (Addendum)
 [name],  Thank you for taking the time for your Medicare Wellness Visit. I appreciate your continued commitment to your health goals. Please review the care plan we discussed, and feel free to reach out if I can assist you further.  Medicare recommends these wellness visits once per year to help you and your care team stay ahead of potential health issues. These visits are designed to focus on prevention, allowing your provider to concentrate on managing your acute and chronic conditions during your regular appointments.  Please note that Annual Wellness Visits do not include a physical exam. Some assessments may be limited, especially if the visit was conducted virtually. If needed, we may recommend a separate in-person follow-up with your provider.  Ongoing Care Seeing your primary care provider every 3 to 6 months helps us  monitor your health and provide consistent, personalized care. N=  Referrals If a referral was made during today's visit and you haven't received any updates within two weeks, please contact the referred provider directly to check on the status.  Recommended Screenings:  Health Maintenance  Topic Date Due   COVID-19 Vaccine (1) Never done   Complete foot exam   Never done   Eye exam for diabetics  Never done   Yearly kidney health urinalysis for diabetes  Never done   Hepatitis C Screening  Never done   DTaP/Tdap/Td vaccine (1 - Tdap) Never done   Pneumococcal Vaccine for age over 26 (1 of 2 - PCV) Never done   Zoster (Shingles) Vaccine (1 of 2) Never done   Hemoglobin A1C  08/26/2023   Yearly kidney function blood test for diabetes  02/23/2024   Flu Shot  Never done   Breast Cancer Screening  04/28/2024   Medicare Annual Wellness Visit  05/15/2025   Colon Cancer Screening  05/28/2027   DEXA scan (bone density measurement)  Completed   Meningitis B Vaccine  Aged Out       05/15/2024   11:02 AM  Advanced Directives  Does Patient Have a Medical Advance  Directive? Yes  Type of Estate agent of [place];Living will  Does patient want to make changes to medical advance directive? No - Patient declined  Copy of Healthcare Power of Attorney in Chart? No - copy requested   Advance Care Planning is important because it: Ensures you receive medical care that aligns with your values, goals, and preferences. Provides guidance to your family and loved ones, reducing the emotional burden of decision-making during critical moments.  Vision: Annual vision screenings are recommended for early detection of glaucoma, cataracts, and diabetic retinopathy. These exams can also reveal signs of chronic conditions such as diabetes and high blood pressure.  Dental: Annual dental screenings help detect early signs of oral cancer, gum disease, and other conditions linked to overall health, including heart disease and diabetes.  Please see the attached documents for additional preventive care recommendations.

## 2024-05-23 ENCOUNTER — Other Ambulatory Visit

## 2024-05-23 ENCOUNTER — Encounter (HOSPITAL_BASED_OUTPATIENT_CLINIC_OR_DEPARTMENT_OTHER): Admitting: Family Medicine

## 2024-05-24 ENCOUNTER — Ambulatory Visit

## 2024-05-24 ENCOUNTER — Ambulatory Visit: Admitting: Oncology

## 2024-06-02 ENCOUNTER — Encounter: Payer: Self-pay | Admitting: Oncology

## 2024-12-11 ENCOUNTER — Other Ambulatory Visit

## 2024-12-12 ENCOUNTER — Ambulatory Visit: Admitting: Oncology

## 2024-12-12 ENCOUNTER — Ambulatory Visit
